# Patient Record
Sex: Female | Born: 1980 | Race: White | Hispanic: No | Marital: Married | Smoking: Never smoker
Health system: Southern US, Community
[De-identification: ages and names within clinical notes are randomized; demographics above are authoritative.]

## PROBLEM LIST (undated history)

## (undated) ENCOUNTER — Inpatient Hospital Stay (HOSPITAL_COMMUNITY): Payer: Self-pay

## (undated) DIAGNOSIS — F419 Anxiety disorder, unspecified: Secondary | ICD-10-CM

## (undated) DIAGNOSIS — O1490 Unspecified pre-eclampsia, unspecified trimester: Secondary | ICD-10-CM

## (undated) DIAGNOSIS — R519 Headache, unspecified: Secondary | ICD-10-CM

## (undated) DIAGNOSIS — K219 Gastro-esophageal reflux disease without esophagitis: Secondary | ICD-10-CM

## (undated) HISTORY — DX: Anxiety disorder, unspecified: F41.9

## (undated) HISTORY — DX: Unspecified pre-eclampsia, unspecified trimester: O14.90

## (undated) HISTORY — DX: Headache, unspecified: R51.9

---

## 2003-01-02 ENCOUNTER — Other Ambulatory Visit: Admission: RE | Admit: 2003-01-02 | Discharge: 2003-01-02 | Payer: Self-pay | Admitting: Gynecology

## 2003-10-07 ENCOUNTER — Emergency Department (HOSPITAL_COMMUNITY): Admission: EM | Admit: 2003-10-07 | Discharge: 2003-10-07 | Payer: Self-pay | Admitting: Emergency Medicine

## 2004-05-29 ENCOUNTER — Emergency Department (HOSPITAL_COMMUNITY): Admission: EM | Admit: 2004-05-29 | Discharge: 2004-05-29 | Payer: Self-pay | Admitting: Emergency Medicine

## 2004-08-25 DIAGNOSIS — O1490 Unspecified pre-eclampsia, unspecified trimester: Secondary | ICD-10-CM

## 2004-08-25 HISTORY — DX: Unspecified pre-eclampsia, unspecified trimester: O14.90

## 2004-09-05 ENCOUNTER — Observation Stay (HOSPITAL_COMMUNITY): Admission: AD | Admit: 2004-09-05 | Discharge: 2004-09-05 | Payer: Self-pay | Admitting: Obstetrics

## 2004-09-08 ENCOUNTER — Ambulatory Visit: Payer: Self-pay | Admitting: Pulmonary Disease

## 2004-09-08 ENCOUNTER — Inpatient Hospital Stay (HOSPITAL_COMMUNITY): Admission: AD | Admit: 2004-09-08 | Discharge: 2004-09-23 | Payer: Self-pay | Admitting: Obstetrics & Gynecology

## 2004-09-11 ENCOUNTER — Ambulatory Visit: Payer: Self-pay | Admitting: Neonatology

## 2004-09-15 ENCOUNTER — Encounter (INDEPENDENT_AMBULATORY_CARE_PROVIDER_SITE_OTHER): Payer: Self-pay | Admitting: Specialist

## 2004-09-20 ENCOUNTER — Encounter: Payer: Self-pay | Admitting: Obstetrics

## 2004-10-05 ENCOUNTER — Ambulatory Visit: Payer: Self-pay | Admitting: Internal Medicine

## 2004-11-04 ENCOUNTER — Ambulatory Visit: Payer: Self-pay | Admitting: Internal Medicine

## 2005-07-12 ENCOUNTER — Ambulatory Visit: Payer: Self-pay | Admitting: Internal Medicine

## 2006-01-01 ENCOUNTER — Emergency Department (HOSPITAL_COMMUNITY): Admission: EM | Admit: 2006-01-01 | Discharge: 2006-01-01 | Payer: Self-pay | Admitting: Emergency Medicine

## 2010-02-25 ENCOUNTER — Emergency Department (HOSPITAL_COMMUNITY): Admission: EM | Admit: 2010-02-25 | Discharge: 2010-02-25 | Payer: Self-pay | Admitting: Family Medicine

## 2011-01-12 LAB — POCT RAPID STREP A (OFFICE): Streptococcus, Group A Screen (Direct): NEGATIVE

## 2011-03-12 NOTE — Discharge Summary (Signed)
NAMEMANAL, KREUTZER            ACCOUNT NO.:  [id number]   MEDICAL RECORD NO.:  [id number]          PATIENT TYPE:  INP   LOCATION:  9145                          FACILITY:  [facility]   PHYSICIAN:  Charles A. Clearance Coots, M.D.DATE OF BIRTH:  04/21/1981   DATE OF ADMISSION:  09/08/2004  DATE OF DISCHARGE:  09/23/2004                                 DISCHARGE SUMMARY   ADMISSION DIAGNOSES:  1.  At [redacted] weeks gestation.  2.  Preeclampsia.   DISCHARGE DIAGNOSES:  1.  At [redacted] weeks gestation.  2.  Preeclampsia.  3.  Status post delivery by cesarean section for severe preeclampsia on      September 15, 2004, at 12:54.  A viable female infant with Apgars of 8 at      one minute, 8 at nine minutes, weight of 1373 g, length of 38 cm.  4.  Postpartum preeclampsia.  5.  Posterior reversible encephalopathy syndrome (PRES), resolved.   Infant in neonatal intensive care unit for prematurity.  Mother discharged  home in good condition.   REASON FOR ADMISSION:  A 30 year old white female G1 presents at 30-2/[redacted]  weeks gestation in follow-up after being seen over the weekend in the  maternity admissions unit for increased blood pressure with headache.  Patient presents to the office the day of admission with the complaint of  headache.  She also had an 11 pound weight gain in two weeks.  A decision  was made to admit to the hospital for pregnancy induced hypertension,  probable preeclampsia.   PAST MEDICAL HISTORY:  1.  Surgeries:  None.  2.  Illnesses:  None.   MEDICATIONS:  Prenatal vitamins.   ALLERGIES:  No known drug allergies.   SOCIAL HISTORY:  Single.  Negative alcohol or drug use.  Smokes  approximately four cigarettes daily.   PHYSICAL EXAMINATION:  GENERAL APPEARANCE:  A well-developed, well-nourished  white female in no acute distress.  VITAL SIGNS:  Temperature 97.6, pulse 68, respiratory rate 18, blood  pressure 142/92, height 5 feet 3 inches, weight 173 pounds.  HEENT:  Normal except  for facial edema.  LUNGS:  Clear to auscultation bilaterally.  CARDIOVASCULAR:  Regular rate and rhythm without murmurs, rubs, or gallops.  ABDOMEN:  Gravid, nontender.  EXTREMITIES:  Deep tendon reflexes were 2+ without clonus.  CERVIX:  Cervical examination was omitted.   External fetal monitoring revealed fetal heart rate baseline of 140 to 150  beats per minute, moderate variability, positive reactivity with no  decelerations.  There were no uterine contractions.   LABORATORY DATA:  Uric acid significant for 6.1, SGOT and SGPT 36 and 29,  respectively. Serum creatinine 0.8. LDH 145.  Hemoglobin 11.9, hematocrit  34.2, platelets 180,000.   HOSPITAL COURSE:  The patient was admitted and placed on bed rest.  A 24-  hour urine was started for total protein and creatinine clearance and daily  weights were monitored. She was started on magnesium sulfate for seizure  prophylaxis. A 24-hour urine revealed a total protein of 5 g.  The patient,  for the first several days, did fairly well with reactive  monitoring on  heart rate tracings.  She was given steroids on hospital day #45for  acceleration of fetal lung maturity.  Maternal fetal medicine consultation  was obtained on hospital day #6 because the patient started feeling chest  tightness and had developed some blurred vision.  Chest x-ray was obtained  which revealed pulmonary edema.  A decision was made to proceed with  delivery.  The patient was moved to labor and delivery for cervical ripening  and induction of labor but she complained of having worsening blurred vision  and could not see very well and on examination after cervical ripening, the  cervix was still very unfavorable for rapid delivery being only 1 cm dilated  externally with closed internal os examination and about 40 to 50% effaced  and the vertex at a -3 station.  Because of the very unfavorable cervix and  severity of the preeclampsia approaching eclampsia with the  blurred vision.  A decision was made to proceed with cesarean section delivery.   The patient underwent a cesarean section delivery on September 15, 2004, at  [redacted] weeks gestation.  This was a primary low transverse cesarean section. A  viable infant was delivered.  There were no intraoperative complications.  The patient was continued on magnesium sulfate postoperatively for seizure  prophylaxis.  She had significantly elevated blood pressures postoperatively  and was started on several antihypertensives without good success with  control of her blood pressure.  The patient also had continued blurred  vision which had transiently cleared immediately after delivery but on  postoperative day #2, the blurred vision had returned and was worse.  Because of inability to gain good control of blood pressure, critical care  consultation was made with Dr. Sarina Ser, pulmonary and critical care  specialist, and recommendations were made regarding patient's blood pressure  management.  CT scan and MRI was also obtained of the head and further  diagnosis of posterior reversible encephalopathy syndrome (PRES) was  diagnosed with CT findings as the etiology of the patient's blurred vision  and at that point, by postoperative day #5 the patient was beginning to  diurese quite well but continued to have blurred vision.  Weight loss was  significant after the patient began to diurese well and magnesium sulfate  was then discontinued.  The patient continued to have brisk diuresis and  weight loss with much improved blood pressure control after consultation and  recommendations by Dr. Jayme Cloud were made.  The patient was maintained on  antihypertensive regimen of Cardene 60 mg SR b.i.d. and Maxzide 25 one  tablet b.i.d.   The patient continued to have resolution of preeclampsia and the visual  changes completely resolved by postoperative day #8 and the patient was therefore discharged home in good  condition on postoperative day #8.   DISCHARGE LABORATORY DATA:  Hemoglobin 9.6, hematocrit 27.6, white blood  cell count 13,700, platelets 172,000.  Sodium 138, potassium 3.5, glucose  79, creatinine 0.8, BUN 12.   DISCHARGE MEDICATIONS:  1.  Continue Cardene 60 mg SR b.i.d.  2.  Continue Maxzide 25 one pill b.i.d.   The patient is to follow up with Dr. Sonda Primes at [facility] on October 05, 2004.  Appointment was made prior to  discharge for the patient.  She will also follow up at the [facility] in two weeks for a postpartum check-up.   Instructions were given and routine written instructions per booklet were  given  for obstetrical discharge after cesarean section.     Char   CAH/MEDQ  D:  09/23/2004  T:  09/23/2004  Job:  [id number]   cc:   Sarina Ser, M.D.  Pulmonary and Critical Care Medicine  [address].   Georgina Quint. Plotnikov, M.D. [facility]

## 2011-03-12 NOTE — Op Note (Signed)
NAMESHARIE, Tina [name]            ACCOUNT NO.:  [id number]   MEDICAL RECORD NO.:  [id number]          PATIENT TYPE:  INP   LOCATION:  9499                          FACILITY:  [facility]   PHYSICIAN:  Roseanna Rainbow, M.D.DATE OF BIRTH:  November 10, 1980   DATE OF PROCEDURE:  09/15/2004  DATE OF DISCHARGE:                                 OPERATIVE REPORT   PREOPERATIVE DIAGNOSES:  Intrauterine pregnancy at term at 31+ weeks with  severe preeclampsia, pulmonary edema, neurological symptoms.   POSTOPERATIVE DIAGNOSES:  Intrauterine pregnancy at term at 31+ weeks with  severe preeclampsia, pulmonary edema, neurological symptoms.   PROCEDURE:  Primary low uterine flap elliptical cesarean delivery via  Pfannenstiel skin incision.   SURGEONS:  1.  Roseanna Rainbow, M.D.  2.  Charles A. Clearance Coots, M.D.   ANESTHESIA:  Spinal.   ESTIMATED BLOOD LOSS:  800 mL.   COMPLICATIONS:  None.   FLUIDS AND URINE OUTPUT:  Per anesthesiology.   DESCRIPTION OF PROCEDURE:  The patient was taken to the operating room.  She  was placed in the dorsal supine position and prepped and draped in the usual  sterile fashion.  A Pfannenstiel skin incision was then made with a scalpel  and carried down to the underlying fascia with the Bovie.  The fascia was  nicked in the midline.  This incision was then extended bilaterally with  curved Mayo scissors.  The superior aspect of the fascial incision was then  tented up with Kocher clamps and the underlying rectus muscles dissected  off.  The inferior aspect of the fascial incision was then manipulated in a  similar fashion.  The rectus muscles were separated in the midline.  The  parietal peritoneum was entered bluntly.  The peritoneal incision was then  extended superiorly and inferiorly with good visualization of the bladder.  The bladder blade was then placed; the vesicouterine peritoneum was tented  up and entered sharply with Metzenbaum scissors.  This  incision was then  extended bilaterally and the bladder flap created bluntly.  The bladder  blade was then replaced.  The lower uterine segment was then incised in a  transverse fashion with the scalpel.  This incision was then extended  bilateral with the bandage scissors.  The infant's head was delivered  atraumatically.  A nuchal cord x 2 was noted.  The cord was clamped and cut,  and the infant was handed off to the awaiting neonatologist.  Apgars were 8  at one and 5 minutes, respectively.  An umbilical artery pH was sent.  The  placenta was then removed.  The intrauterine cavity was evacuated of any  remaining amniotic fluid, clots, and debris with a moist laparotomy sponge.  There is moderate atony appreciated at this point.  The uterus was  exteriorized and massaged.  Pitocin was infiltrated into the fundus.  The  atony responded appropriately with improved tone.  The uterine incision was  then reapproximated in a running interlocking fashion with 0 Monocryl.  A  second imbricating layer was then placed.  The uterus was returned to the  abdomen.  The paracolic  gutters were then copiously irrigated.  The parietal  peritoneum was reapproximated with 0 Vicryl.  The fascia was reapproximated  in a  running fashion with 0 Vicryl.  The skin was reapproximated with staples.  At the close of the procedure, the instrument and pack counts were said to  be correct x 2.  The patient was taken to the PACU awake and in stable  condition.  One gram of cefazolin was given at cord clamp.     [name]  D:  09/15/2004  T:  09/15/2004  Job:  [id number]

## 2013-08-06 ENCOUNTER — Other Ambulatory Visit: Payer: Self-pay

## 2013-08-08 ENCOUNTER — Encounter (HOSPITAL_COMMUNITY): Payer: Self-pay | Admitting: *Deleted

## 2013-08-08 ENCOUNTER — Inpatient Hospital Stay (HOSPITAL_COMMUNITY): Payer: Managed Care, Other (non HMO)

## 2013-08-08 ENCOUNTER — Inpatient Hospital Stay (HOSPITAL_COMMUNITY)
Admission: AD | Admit: 2013-08-08 | Discharge: 2013-08-08 | Disposition: A | Discharge status: Home / Self Care | Payer: Managed Care, Other (non HMO) | Source: Ambulatory Visit | Attending: Obstetrics & Gynecology | Admitting: Obstetrics & Gynecology

## 2013-08-08 ENCOUNTER — Other Ambulatory Visit (HOSPITAL_COMMUNITY): Payer: Managed Care, Other (non HMO)

## 2013-08-08 DIAGNOSIS — O219 Vomiting of pregnancy, unspecified: Secondary | ICD-10-CM

## 2013-08-08 DIAGNOSIS — O99891 Other specified diseases and conditions complicating pregnancy: Secondary | ICD-10-CM | POA: Insufficient documentation

## 2013-08-08 DIAGNOSIS — O26899 Other specified pregnancy related conditions, unspecified trimester: Secondary | ICD-10-CM

## 2013-08-08 DIAGNOSIS — M549 Dorsalgia, unspecified: Secondary | ICD-10-CM | POA: Insufficient documentation

## 2013-08-08 DIAGNOSIS — M533 Sacrococcygeal disorders, not elsewhere classified: Secondary | ICD-10-CM | POA: Insufficient documentation

## 2013-08-08 DIAGNOSIS — Z3490 Encounter for supervision of normal pregnancy, unspecified, unspecified trimester: Secondary | ICD-10-CM

## 2013-08-08 DIAGNOSIS — O210 Mild hyperemesis gravidarum: Secondary | ICD-10-CM | POA: Insufficient documentation

## 2013-08-08 LAB — CBC
HCT: 34 % — ABNORMAL LOW (ref 36.0–46.0)
Hemoglobin: 11.9 g/dL — ABNORMAL LOW (ref 12.0–15.0)
MCH: 31.8 pg (ref 26.0–34.0)
MCHC: 35 g/dL (ref 30.0–36.0)
MCV: 90.9 fL (ref 78.0–100.0)
Platelets: 216 10*3/uL (ref 150–400)
RBC: 3.74 MIL/uL — ABNORMAL LOW (ref 3.87–5.11)
RDW: 12.8 % (ref 11.5–15.5)
WBC: 7.9 10*3/uL (ref 4.0–10.5)

## 2013-08-08 LAB — WET PREP, GENITAL
Clue Cells Wet Prep HPF POC: NONE SEEN
Trich, Wet Prep: NONE SEEN
Yeast Wet Prep HPF POC: NONE SEEN

## 2013-08-08 LAB — URINALYSIS, ROUTINE W REFLEX MICROSCOPIC
Glucose, UA: NEGATIVE mg/dL
Hgb urine dipstick: NEGATIVE
Ketones, ur: NEGATIVE mg/dL
Nitrite: NEGATIVE
Specific Gravity, Urine: >1.03 — ABNORMAL HIGH (ref 1.005–1.030)
Urobilinogen, UA: 0.2 mg/dL (ref 0.0–1.0)
pH: 6 (ref 5.0–8.0)

## 2013-08-08 LAB — HCG, QUANTITATIVE, PREGNANCY: hCG, Beta Chain, Quant, S: 31709 m[IU]/mL — ABNORMAL HIGH (ref <5)

## 2013-08-08 LAB — POCT PREGNANCY, URINE: Preg Test, Ur: POSITIVE — AB

## 2013-08-08 MED ORDER — OXYCODONE-ACETAMINOPHEN 5-325 MG PO TABS: 1.0000 | ORAL_TABLET | Freq: Four times a day (QID) | ORAL | Status: DC | PRN

## 2013-08-08 MED ORDER — DOXYLAMINE-PYRIDOXINE 10-10 MG PO TBEC: 1.0000 | DELAYED_RELEASE_TABLET | Freq: Two times a day (BID) | ORAL | Status: DC

## 2013-08-08 NOTE — MAU Provider Note (Signed)
Chief Complaint: Back Pain   None    SUBJECTIVE HPI: Tina [name] is a 32 y.o. G2P0101 at [redacted]w[redacted]d by LMP who presents to maternity admissions reporting severe back pain x2 weeks.  Patient's last menstrual period was 06/30/2013.  She is unsure of this date.  She denies change in activity or injury prior to onset of back pain.  She reports she has never had this type of pain before.  Tylenol has not relieved the pain.  She had preeclampsia in her previous pregnancy and had an emergency C/S and is worried that something is wrong with this pregnancy.  She reports daily nausea, without vomiting, and this makes it hard for her to keep down fluids.  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 She denies vaginal bleeding, vaginal itching/burning, urinary symptoms, h/a, dizziness, n/v, or fever/chills.   History reviewed. No pertinent past medical history. Past Surgical History  Procedure Laterality Date  . Cesarean section     History   Social History  . Marital Status: Married    Spouse Name: N/A    Number of Children: N/A  . Years of Education: N/A   Occupational History  . Not on file.   Social History Main Topics  . Smoking status: Never Smoker   . Smokeless tobacco: Never Used  . Alcohol Use: No  . Drug Use: No  . Sexual Activity: Yes    Birth Control/ Protection: None   Other Topics Concern  . Not on file   Social History Narrative  . No narrative on file   No current facility-administered medications on file prior to encounter.   No current outpatient prescriptions on file prior to encounter.   No Known Allergies  ROS: Pertinent items in HPI  OBJECTIVE Blood pressure 130/69, pulse 70, temperature 98.4 F (36.9 C), temperature source Oral, resp. rate 16, height 5' 3.5" (1.613 m), weight 90.992 kg (200 lb 9.6 oz), last menstrual period 06/30/2013, SpO2 100.00%. GENERAL: Well-developed, well-nourished female in  no acute distress.  HEENT: Normocephalic HEART: normal rate RESP: normal effort ABDOMEN: Soft, non-tender BACK:  Tenderness of sacrum and sacroiliac joint bilaterally EXTREMITIES: Nontender, no edema NEURO: Alert and oriented Pelvic exam: Cervix pink, visually closed, without lesion, scant white creamy discharge, vaginal walls and external genitalia normal Bimanual exam: Cervix 0/long/high, firm, anterior, neg CMT, uterus nontender, nonenlarged, adnexa without tenderness, enlargement, or mass  LAB RESULTS Results for orders placed during the hospital encounter of 08/08/13 (from the past 24 hour(s))  URINALYSIS, ROUTINE W REFLEX MICROSCOPIC     Status: Abnormal   Collection Time    08/08/13  2:10 PM      Result Value Range   Color, Urine YELLOW  YELLOW   APPearance CLEAR  CLEAR   Specific Gravity, Urine >1.030 (*) 1.005 - 1.030   pH 6.0  5.0 - 8.0   Glucose, UA NEGATIVE  NEGATIVE mg/dL   Hgb urine dipstick NEGATIVE  NEGATIVE   Bilirubin Urine NEGATIVE  NEGATIVE   Ketones, ur NEGATIVE  NEGATIVE mg/dL   Protein, ur NEGATIVE  NEGATIVE mg/dL   Urobilinogen, UA 0.2  0.0 - 1.0 mg/dL   Nitrite NEGATIVE  NEGATIVE   Leukocytes, UA NEGATIVE  NEGATIVE  POCT PREGNANCY, URINE     Status: Abnormal   Collection Time    08/08/13  2:14 PM      Result Value Range   Preg Test, Ur POSITIVE (*) NEGATIVE  CBC     Status: Abnormal   Collection Time    08/08/13  3:03 PM      Result Value Range   WBC 7.9  4.0 - 10.5 K/uL   RBC 3.74 (*) 3.87 - 5.11 MIL/uL   Hemoglobin 11.9 (*) 12.0 - 15.0 g/dL   HCT 16.1 (*) 09.6 - 04.5 %   MCV 90.9  78.0 - 100.0 fL   MCH 31.8  26.0 - 34.0 pg   MCHC 35.0  30.0 - 36.0 g/dL   RDW 40.9  81.1 - 91.4 %   Platelets 216  150 - 400 K/uL    IMAGING US Ob Comp Less 14 Wks  08/08/2013   CLINICAL DATA:  Back pain.  A  EXAM: OBSTETRIC <14 WK ULTRASOUND  TECHNIQUE: Transabdominal ultrasound was performed for evaluation of the gestation as well as the maternal uterus and  adnexal regions.  COMPARISON:  None.  FINDINGS: Intrauterine gestational sac: Visualized/normal in shape.  Yolk sac:  Present  Embryo:  Present  Cardiac Activity: Present  Heart Rate: 115 beats per min bpm  MSD:   mm    w     d  CRL:   2.8  mm   6 w 0 d                  [place] EDC: 04/03/2014  Maternal uterus/adnexae: No subchorionic hemorrhage. Ovaries are symmetric in size and echotexture. No adnexal masses. No free fluid.  IMPRESSION: Six week intrauterine pregnancy. Fetal heart rate 115 beats per min. No acute maternal findings.   Electronically Signed   By: Charlett Nose M.D.   On: 08/08/2013 16:37    ASSESSMENT 1. Sacroiliac pain in pregnancy   2. Normal IUP (intrauterine pregnancy) on prenatal ultrasound   3. Nausea/vomiting in pregnancy     PLAN Discharge home Education regarding diagnosis, exercises, good ergonomics info given Percocet 5/325, take 1 Q 6 hours PRN x10 tablets Diclegis BID F/U in office--discuss physical therapy, brace/binder in office Return to MAU as needed    Medication List    ASK your doctor about these medications       GINGER ROOT PO  Take 1 tablet by mouth daily.         Sharen Counter Certified Nurse-Midwife 08/08/2013  3:49 PM

## 2013-08-08 NOTE — MAU Note (Signed)
Patient states she has had a positive pregnancy test at an Urgent Care. Has had low back pain for 2 weeks. Denies bleeding but has a little vaginal discharge.

## 2013-08-09 LAB — GC/CHLAMYDIA PROBE AMP
CT Probe RNA: NEGATIVE
GC Probe RNA: NEGATIVE

## 2013-08-21 ENCOUNTER — Encounter: Payer: Self-pay | Admitting: Advanced Practice Midwife

## 2013-08-21 ENCOUNTER — Ambulatory Visit (INDEPENDENT_AMBULATORY_CARE_PROVIDER_SITE_OTHER): Payer: Managed Care, Other (non HMO) | Admitting: Advanced Practice Midwife

## 2013-08-21 VITALS — BP 124/81 | HR 66 | Temp 98.4°F

## 2013-08-21 VITALS — BP 124/81 | Temp 98.4°F | Wt 199.2 lb

## 2013-08-21 DIAGNOSIS — O219 Vomiting of pregnancy, unspecified: Secondary | ICD-10-CM | POA: Insufficient documentation

## 2013-08-21 DIAGNOSIS — Z3481 Encounter for supervision of other normal pregnancy, first trimester: Secondary | ICD-10-CM

## 2013-08-21 DIAGNOSIS — Z3201 Encounter for pregnancy test, result positive: Secondary | ICD-10-CM

## 2013-08-21 DIAGNOSIS — O09291 Supervision of pregnancy with other poor reproductive or obstetric history, first trimester: Secondary | ICD-10-CM | POA: Insufficient documentation

## 2013-08-21 DIAGNOSIS — Z98891 History of uterine scar from previous surgery: Secondary | ICD-10-CM

## 2013-08-21 DIAGNOSIS — O09299 Supervision of pregnancy with other poor reproductive or obstetric history, unspecified trimester: Secondary | ICD-10-CM

## 2013-08-21 DIAGNOSIS — Z3480 Encounter for supervision of other normal pregnancy, unspecified trimester: Secondary | ICD-10-CM

## 2013-08-21 LAB — COMPREHENSIVE METABOLIC PANEL
AST: 28 U/L (ref 0–37)
Albumin: 4.2 g/dL (ref 3.5–5.2)
Alkaline Phosphatase: 56 U/L (ref 39–117)
BUN: 9 mg/dL (ref 6–23)
CO2: 24 mEq/L (ref 19–32)
Calcium: 9.4 mg/dL (ref 8.4–10.5)
Chloride: 101 mEq/L (ref 96–112)
Creat: 0.7 mg/dL (ref 0.50–1.10)
Glucose, Bld: 69 mg/dL — ABNORMAL LOW (ref 70–99)
Potassium: 3.7 mEq/L (ref 3.5–5.3)
Sodium: 136 mEq/L (ref 135–145)
Total Bilirubin: 0.5 mg/dL (ref 0.3–1.2)
Total Protein: 6.8 g/dL (ref 6.0–8.3)

## 2013-08-21 LAB — POCT URINALYSIS DIPSTICK
Blood, UA: NEGATIVE
Glucose, UA: NEGATIVE
Ketones, UA: NEGATIVE
Nitrite, UA: NEGATIVE
Spec Grav, UA: 1.015
pH, UA: 7

## 2013-08-21 LAB — LACTATE DEHYDROGENASE: LDH: 171 U/L (ref 94–250)

## 2013-08-21 MED ORDER — DOCUSATE SODIUM 100 MG PO CAPS: 100.0000 mg | ORAL_CAPSULE | Freq: Two times a day (BID) | ORAL | Status: DC | PRN

## 2013-08-21 MED ORDER — METOCLOPRAMIDE HCL 10 MG PO TABS: 10.0000 mg | ORAL_TABLET | Freq: Three times a day (TID) | ORAL | Status: DC

## 2013-08-21 MED ORDER — ONDANSETRON HCL 4 MG PO TABS: 4.0000 mg | ORAL_TABLET | Freq: Three times a day (TID) | ORAL | Status: DC | PRN

## 2013-08-21 NOTE — Progress Notes (Signed)
Pulse- 66 . Subjective:    Tina [name] is being seen today for her first obstetrical visit.  This is not a planned pregnancy. She is at [redacted]w[redacted]d gestation. Her obstetrical history is significant for pre-eclampsia. Relationship with [name]: spouse, living together. Patient does intend to breast feed. Pregnancy history fully reviewed.  Patient reports she is experiencing vomitting at night. No relief w/ Diclegis. Would like to try other methods for control. Not able to eat or drink much, feeling miserable.  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 Reports MRI showed fluid on the brain.  Very anxious for this pregnancy. Same FOC.  Menstrual History: OB History   Grav Para Term Preterm Abortions TAB SAB Ect Mult Living   2 1 0 1 0 0 0 0 0 1       Menarche age: 65 Patient's last menstrual period was 06/30/2013.    The following portions of the patient's history were reviewed and updated as appropriate: allergies, current medications, past family history, past medical history, past social history, past surgical history and problem list.  Review of Systems A comprehensive review of systems was negative except for: Constitutional: positive for unknown if patient has had weight loss Gastrointestinal: positive for nausea and vomiting Musculoskeletal: positive for back pain    Objective:    BP 124/81  Temp(Src) 98.4 F (36.9 C)  Wt 199 lb 3.2 oz (90.357 kg)  BMI 34.73 kg/m2  LMP 06/30/2013  General Appearance:    Alert, cooperative, no distress, appears stated age  Head:    Normocephalic, without obvious abnormality, atraumatic  Eyes:    PERRL, conjunctiva/corneas clear, EOM's intact, fundi    benign, both eyes  Ears:    Normal TM's and external ear canals, both ears  Nose:   Nares normal, septum midline, mucosa normal, no drainage    or sinus tenderness  Throat:   Lips, mucosa, and tongue normal; teeth and gums normal  Neck:   Supple,  symmetrical, trachea midline, no adenopathy;    thyroid:  no enlargement/tenderness/nodules; no carotid   bruit or JVD  Back:     Symmetric, no curvature, ROM normal, no CVA tenderness  Lungs:     Clear to auscultation bilaterally, respirations unlabored  Chest Wall:    No tenderness or deformity   Heart:    Regular rate and rhythm, S1 and S2 normal, no murmur, rub   or gallop  Breast Exam:    No tenderness, masses, or nipple abnormality  Abdomen:     Soft, non-tender, bowel sounds active all four quadrants,    no masses, no organomegaly  Genitalia:  Not indicated  Rectal:    Extremities:   Extremities normal, atraumatic, no cyanosis or edema  Pulses:   2+ and symmetric all extremities  Skin:   Skin color, texture, turgor normal, no rashes or lesions  Lymph nodes:   Cervical, supraclavicular, and axillary nodes normal  Neurologic:   CNII-XII intact, normal strength, sensation and reflexes    throughout      Assessment:    Pregnancy at [redacted]w[redacted]d weeks by L, 6 wk [place] Patient Active Problem List   Diagnosis Date Noted  . Hx of HELLP syndrome, currently pregnant 08/21/2013  . H/O: C-section 08/21/2013  . Nausea and vomiting in pregnancy 08/21/2013  . High risk pregnancy due to history of previous obstetrical problem in first trimester 08/21/2013      Plan:    Initial labs drawn. Prenatal vitamins.  Counseling provided regarding continued use of seat belts, cessation of alcohol consumption, smoking or use of illicit drugs; infection precautions i.e., influenza/TDAP immunizations, toxoplasmosis,CMV, parvovirus, listeria and varicella; workplace safety, exercise during pregnancy; routine dental care, safe medications, sexual activity, hot tubs, saunas, pools, travel, caffeine use, fish and methlymercury, potential toxins, hair treatments, varicose veins Weight gain recommendations reviewed: underweight/BMI< 18.5--> gain 28 - 40 lbs; normal weight/BMI 18.5 - 24.9--> gain 25 - 35 lbs;  overweight/BMI 25 - 29.9--> gain 15 - 25 lbs; obese/BMI >30->gain  11 - 20 lbs Problem list reviewed and updated. AFP3 discussed: plan at subsequent visits.. Role of ultrasound in pregnancy discussed; fetal survey: requested. Amniocentesis discussed: not indicated. Follow up in 2 weeks. Monitor N/V. MFM consult due to Hx of Pre-eclampsia.  Baseline preeclampsia e labs today, patient to bring in 24 hour urine NV.   80% of 40 min visit spent on counseling and coordination of care.   Charnele Semple Wilson Singer CNM

## 2013-08-22 LAB — OBSTETRIC PANEL
Antibody Screen: NEGATIVE
Basophils Absolute: 0 10*3/uL (ref 0.0–0.1)
Basophils Relative: 0 % (ref 0–1)
Eosinophils Absolute: 0.1 10*3/uL (ref 0.0–0.7)
Eosinophils Relative: 1 % (ref 0–5)
HCT: 37.1 % (ref 36.0–46.0)
Hemoglobin: 12.9 g/dL (ref 12.0–15.0)
Hepatitis B Surface Ag: NEGATIVE
Lymphocytes Relative: 23 % (ref 12–46)
Lymphs Abs: 2.1 10*3/uL (ref 0.7–4.0)
MCH: 32.8 pg (ref 26.0–34.0)
MCHC: 34.8 g/dL (ref 30.0–36.0)
MCV: 94.4 fL (ref 78.0–100.0)
Monocytes Absolute: 0.5 10*3/uL (ref 0.1–1.0)
Monocytes Relative: 6 % (ref 3–12)
Neutro Abs: 6.2 10*3/uL (ref 1.7–7.7)
Neutrophils Relative %: 70 % (ref 43–77)
Platelets: 248 10*3/uL (ref 150–400)
RBC: 3.93 MIL/uL (ref 3.87–5.11)
Rh Type: POSITIVE

## 2013-08-22 LAB — HIV ANTIBODY (ROUTINE TESTING W REFLEX): HIV: NONREACTIVE

## 2013-08-22 LAB — CULTURE, OB URINE
Colony Count: NO GROWTH
Organism ID, Bacteria: NO GROWTH

## 2013-08-22 LAB — PROTEIN / CREATININE RATIO, URINE
Creatinine, Urine: 229.6 mg/dL
Protein Creatinine Ratio: 0.03 (ref <0.15)
Total Protein, Urine: 8 mg/dL

## 2013-08-22 LAB — VITAMIN D 25 HYDROXY (VIT D DEFICIENCY, FRACTURES): Vit D, 25-Hydroxy: 69 ng/mL (ref 30–89)

## 2013-08-23 LAB — HEMOGLOBINOPATHY EVALUATION
Hemoglobin Other: 0 %
Hgb A2 Quant: 2.4 % (ref 2.2–3.2)
Hgb A: 97.6 % (ref 96.8–97.8)
Hgb F Quant: 0 % (ref 0.0–2.0)
Hgb S Quant: 0 %

## 2013-08-28 ENCOUNTER — Encounter: Payer: Self-pay | Admitting: Obstetrics

## 2013-08-31 ENCOUNTER — Other Ambulatory Visit: Payer: Self-pay | Admitting: Advanced Practice Midwife

## 2013-08-31 DIAGNOSIS — Z3689 Encounter for other specified antenatal screening: Secondary | ICD-10-CM

## 2013-09-03 ENCOUNTER — Other Ambulatory Visit: Payer: Managed Care, Other (non HMO)

## 2013-09-03 DIAGNOSIS — O139 Gestational [pregnancy-induced] hypertension without significant proteinuria, unspecified trimester: Secondary | ICD-10-CM

## 2013-09-03 DIAGNOSIS — O219 Vomiting of pregnancy, unspecified: Secondary | ICD-10-CM

## 2013-09-03 DIAGNOSIS — O09299 Supervision of pregnancy with other poor reproductive or obstetric history, unspecified trimester: Secondary | ICD-10-CM

## 2013-09-03 DIAGNOSIS — Z3481 Encounter for supervision of other normal pregnancy, first trimester: Secondary | ICD-10-CM

## 2013-09-04 ENCOUNTER — Encounter: Payer: Self-pay | Admitting: Advanced Practice Midwife

## 2013-09-04 ENCOUNTER — Ambulatory Visit (HOSPITAL_COMMUNITY): Payer: Managed Care, Other (non HMO)

## 2013-09-04 ENCOUNTER — Ambulatory Visit (HOSPITAL_COMMUNITY): Admission: RE | Admit: 2013-09-04 | Payer: Managed Care, Other (non HMO) | Source: Ambulatory Visit

## 2013-09-04 ENCOUNTER — Ambulatory Visit (INDEPENDENT_AMBULATORY_CARE_PROVIDER_SITE_OTHER): Payer: Managed Care, Other (non HMO) | Admitting: Advanced Practice Midwife

## 2013-09-04 ENCOUNTER — Other Ambulatory Visit: Payer: Self-pay | Admitting: *Deleted

## 2013-09-04 VITALS — BP 129/86 | Temp 99.1°F | Wt 200.0 lb

## 2013-09-04 DIAGNOSIS — Z3480 Encounter for supervision of other normal pregnancy, unspecified trimester: Secondary | ICD-10-CM

## 2013-09-04 DIAGNOSIS — Z3481 Encounter for supervision of other normal pregnancy, first trimester: Secondary | ICD-10-CM

## 2013-09-04 LAB — PROTEIN, URINE, 24 HOUR
Protein, 24H Urine: 80 mg/d (ref 50–100)
Protein, Urine: 8 mg/dL

## 2013-09-04 LAB — CREATININE CLEARANCE, URINE, 24 HOUR
Creatinine, Urine: 198.6 mg/dL
Creatinine: 0.65 mg/dL (ref 0.50–1.10)

## 2013-09-04 LAB — POCT URINALYSIS DIPSTICK
Bilirubin, UA: NEGATIVE
Glucose, UA: NEGATIVE
Ketones, UA: NEGATIVE
Nitrite, UA: NEGATIVE
Urobilinogen, UA: NEGATIVE
pH, UA: 5

## 2013-09-04 MED ORDER — ONDANSETRON HCL 4 MG PO TABS: 4.0000 mg | ORAL_TABLET | Freq: Three times a day (TID) | ORAL | Status: DC | PRN

## 2013-09-04 NOTE — Progress Notes (Signed)
HR - 84 Pt in office for routine office visit, would like a refill for Zofran,

## 2013-09-04 NOTE — Progress Notes (Signed)
Routine Obstetrical Visit  Subjective:    Tina [name] is being seen today for her routine obstetrical visit. She is at [redacted]w[redacted]d gestation.   Patient reports N/V improves w/ zofran, patient would like refill. No relief w/ reglan. Reports constipation.   Patient was never notified of appt scheduled today.   Objective:     BP 129/86  Temp(Src) 99.1 F (37.3 C)  Wt 200 lb (90.719 kg)  LMP 06/30/2013 Physical Exam  Exam   FHR 160 FH SP  Assessment:    Pregnancy: G2P0101 Patient Active Problem List   Diagnosis Date Noted  . Hx of HELLP syndrome, currently pregnant 08/21/2013  . H/O: C-section 08/21/2013  . Nausea and vomiting in pregnancy 08/21/2013  . High risk pregnancy due to history of previous obstetrical problem in first trimester 08/21/2013       Plan:     Prenatal vitamins. Problem list reviewed and updated.  results reviewed. Follow up in 4 weeks. Zofran refill Encouraged patient to take baby aspirin at this time while she awaits MFM appt. Reschedule MFM appt, pending.  80% of 20 min visit spent on counseling and coordination of care.     Aliene Tamura 09/04/2013

## 2013-09-11 ENCOUNTER — Ambulatory Visit (HOSPITAL_COMMUNITY)
Admission: RE | Admit: 2013-09-11 | Discharge: 2013-09-11 | Disposition: A | Discharge status: Home / Self Care | Payer: Managed Care, Other (non HMO) | Source: Ambulatory Visit | Attending: Advanced Practice Midwife | Admitting: Advanced Practice Midwife

## 2013-09-11 ENCOUNTER — Other Ambulatory Visit (HOSPITAL_COMMUNITY): Payer: Self-pay | Admitting: Obstetrics and Gynecology

## 2013-09-11 VITALS — BP 132/83 | HR 62 | Wt 201.0 lb

## 2013-09-11 DIAGNOSIS — O219 Vomiting of pregnancy, unspecified: Secondary | ICD-10-CM

## 2013-09-11 DIAGNOSIS — O09299 Supervision of pregnancy with other poor reproductive or obstetric history, unspecified trimester: Secondary | ICD-10-CM | POA: Insufficient documentation

## 2013-09-11 DIAGNOSIS — Z98891 History of uterine scar from previous surgery: Secondary | ICD-10-CM

## 2013-09-11 DIAGNOSIS — O34219 Maternal care for unspecified type scar from previous cesarean delivery: Secondary | ICD-10-CM | POA: Insufficient documentation

## 2013-09-11 DIAGNOSIS — Z8751 Personal history of pre-term labor: Secondary | ICD-10-CM | POA: Insufficient documentation

## 2013-09-11 DIAGNOSIS — O09291 Supervision of pregnancy with other poor reproductive or obstetric history, first trimester: Secondary | ICD-10-CM

## 2013-09-11 DIAGNOSIS — Z3689 Encounter for other specified antenatal screening: Secondary | ICD-10-CM

## 2013-09-11 MED ORDER — VITAMIN B-6 100 MG PO TABS: 100.0000 mg | ORAL_TABLET | Freq: Every day | ORAL | Status: DC

## 2013-09-11 MED ORDER — ASPIRIN EC 81 MG PO TBEC: 81.0000 mg | DELAYED_RELEASE_TABLET | Freq: Every day | ORAL | Status: DC

## 2013-09-11 NOTE — Consult Note (Signed)
MFM Staff Consultation Note:  DISCUSSION:  By way of consultation, I spoke to Tina [name]  regarding her pregnancy history of severe preeclampsia/HELLP syndrome. I explained to her that her obstetrical history places her at increased risk for recurrence of preeclampsia. I explained the syndrome of preeclampsia as being unique to pregnancy and the associated clinical triad of increased blood pressure, proteinuria and abnormal edema. I also reviewed the underlying pathophysiology of preeclampsia as being rooted in endothelial dysfunction. I explained to her the increased vascular reactivity as well as the leaky endothelial lining of the blood vessels that create an environment of uteroplacental insufficiency with increased risk of intrauterine growth restriction, oligohydramnios and stillbirth. I cited at least a 25% recurrence risk if not higher to her for the spectrum of severe preeclampsia.    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  If PRES is ever diagnosed, not only will she need MgSO4 prophylaxis until 24 hours postpartum, but I additionally would recommend Keppra 500mg  po bid x 2 weeks replete with neurology consultation and follow up (please consult MFM for real time recommendations salient/unique to the actual presentation).  I reviewed her baseline labs, including a preeclampsia panel and 24-hour urine for total protein and creatinine clearance that indicates she does not appear to have evidence of preexisting renal injury (total protein 80mg /dl and reassuring creatinine clearance 239mL/min). If the patient were to develop signs and symptoms of preeclampsia in later pregnancy. I would additionally repeat the 24 hour urine collection for her along with  hepatic and renal function panel. I explained to her there is actually a preventive medicine to possibly reduce incidence of recurrence and subsequent pregnancy affected by severe preeclampsia namely this one. This medication is called low-dose aspirin 81 mg tablet taken daily. It is important that this preventive therapy be started prior to 20 weeks in this pregnancy (script given today for ASA 81mg  po qd, disp: #30, refill: 8) and preferably by 16 weeks in her next pregnancy.  Although normotensive technically at 132/83, I feel she may have some underlying component of vascular disease that predisposes her to [facility].  Today, her blood pressure is just below my threshold (in line with current standard of care) for treatment. If she ever becomes hypertensive, I recommend starting labetalol 200mg  po bid and titrating to efficacy 130-140's/80-90's (not lower to avoid overtreating CHTN and placing increased risk for IUGR). Accordingly, we discussed the fact that patients with chronic hypertension have a significantly increased risk of poor placentation, subsequent fetal growth delay, premature placental aging, placental abruption, preeclampsia, fetal distress, and early fetal death. This would modify the patient's management during pregnancy, which should then include a serial evaluation for fetal growth, and plans for antenatal testing on a twice-weekly basis from at least 32 weeks on.  However, she has yet to meet criteria for [facility], noting I suspect she will between now and 20 weeks.  Given her severity of previous presentation, I would recommend meticulous monitoring of her by way of examination in office with attention to optimal HTN control every 2 weeks until 32 weeks and then weekly thereafter to ensure close maternal monitoring. I counseled her on having a low threshold to present for evaluation in the context of symptoms of preeclampsia. She seemed to have an excellent grasp  of my recommendations. All of  your patient's questions were answered.  Lastly, we discussed nausea of pregnancy prevention and treatment for constipation.  I recommended she begin pyridoxine (vitamin b6) 100mg  po daily for nausea prevention and to increase frequency of colace 100mg  from q12 hours to q6-8 hours to titrate to soft stool.  She was also encouraged to ambulate frequently each day, hydrate herself (10-14 glasses of water/day), and consider adding metamucil OTC for her bowel regimen.  Impressions:  1. H/o severe preeclampsia/HELLP syndrome  2. Borderline HTN  3. Nausea  4. Constipation  SUMMARY OF RECOMMENDATIONS:  1.Initiation of aspirin 81 mg orally daily to help prevention of preeclampsia, intrauterine growth restriction and intrauterine fetal demise.  2. I would repeat the 24 hour urine and baseline preeclampsia labs again if she should ever present with signs or symptoms to prompt a workup for suspected preeclampsia.  3. First trimester screen 2 weeks from today  4.Detailed anatomic survey was arranged within 8 weeks of today.  5. Close monitoring of maternal status as outpatient every 2 weeks until 34 weeks and then weekly thereafter, noting that this will help detection of preeclampsia in early stages to prompt admission and management as clinically appropriate.  6. Pyridoxine 100mg  po daily  7. Colace 100mg  po q6-8 hours.  8. Metamucil OTC as directed  9. Strict maternal counseling with low threshold for presentation to be evaluated in context of signs/symptoms of preeclampsia.  Level III  I spent in excess of 40 minutes in review of medical records, evaluation, and education of your patient in consultation. More than 50% of this time was spent in direct face-to-face counseling. It was a pleasure seeing your patient today in consultation. Thank you for allowing [place] the opportunity to contribute to the care of your patient.  Page with questions.  Merideth Abbey, MD, MS, [facility]

## 2013-09-25 ENCOUNTER — Other Ambulatory Visit: Payer: Self-pay | Admitting: Advanced Practice Midwife

## 2013-09-25 DIAGNOSIS — O09292 Supervision of pregnancy with other poor reproductive or obstetric history, second trimester: Secondary | ICD-10-CM

## 2013-09-28 ENCOUNTER — Ambulatory Visit (HOSPITAL_COMMUNITY): Payer: Managed Care, Other (non HMO)

## 2013-10-02 ENCOUNTER — Ambulatory Visit (INDEPENDENT_AMBULATORY_CARE_PROVIDER_SITE_OTHER): Payer: Managed Care, Other (non HMO) | Admitting: Advanced Practice Midwife

## 2013-10-02 VITALS — BP 123/81 | Temp 99.4°F | Wt 201.0 lb

## 2013-10-02 DIAGNOSIS — O26899 Other specified pregnancy related conditions, unspecified trimester: Secondary | ICD-10-CM

## 2013-10-02 DIAGNOSIS — Z3480 Encounter for supervision of other normal pregnancy, unspecified trimester: Secondary | ICD-10-CM

## 2013-10-02 DIAGNOSIS — Z3481 Encounter for supervision of other normal pregnancy, first trimester: Secondary | ICD-10-CM

## 2013-10-02 DIAGNOSIS — R12 Heartburn: Secondary | ICD-10-CM

## 2013-10-02 DIAGNOSIS — O09299 Supervision of pregnancy with other poor reproductive or obstetric history, unspecified trimester: Secondary | ICD-10-CM

## 2013-10-02 LAB — POCT URINALYSIS DIPSTICK
Blood, UA: NEGATIVE
Glucose, UA: NEGATIVE
Ketones, UA: NEGATIVE
Spec Grav, UA: 1.015
pH, UA: 6

## 2013-10-02 MED ORDER — RANITIDINE HCL 150 MG PO CAPS: 150.0000 mg | ORAL_CAPSULE | Freq: Two times a day (BID) | ORAL | Status: DC

## 2013-10-02 NOTE — Progress Notes (Signed)
P 80 Patient reports her nausea is better, but now she has reflux.

## 2013-10-02 NOTE — Progress Notes (Signed)
Subjective: Tina [name] is a 32 y.o. at 13 weeks by early ultrasound  Patient denies vaginal leaking of fluid or bleeding, denies contractions.  Reports positive fetal movment.  Denies concerns today.  Objective: Filed Vitals:   10/02/13 1008  BP: 123/81  Temp: 99.4 F (37.4 C)   160 FHR SP Fundal Height Fetal Position unknown  Assessment: Patient Active Problem List   Diagnosis Date Noted  . Heartburn in pregnancy 10/02/2013  . Hx of HELLP syndrome, currently pregnant 08/21/2013  . H/O: C-section 08/21/2013  . Nausea and vomiting in pregnancy 08/21/2013  . High risk pregnancy due to history of previous obstetrical problem in first trimester 08/21/2013    Plan: Patient to return to clinic in 2 weeks Patient to RTC every 2 weeks. See MFM note for Management Recommend patient alternate and see the MDs occasionally throughout the pregnancy due to high risk. UA each visit. Patient to cont on Baby Aspirin. Patient declined genetic testing at this time.  Has 18 week [place] scheduled. Reviewed warning signs in pregnancy. Patient to call with concerns PRN. Reviewed triage location. Reviewed comfort measures for heartburn. Zantac 150 BID   20 min spent with patient greater than 80% spent in counseling and coordination of care.

## 2013-10-16 ENCOUNTER — Encounter: Payer: Managed Care, Other (non HMO) | Admitting: Advanced Practice Midwife

## 2013-10-23 ENCOUNTER — Ambulatory Visit (INDEPENDENT_AMBULATORY_CARE_PROVIDER_SITE_OTHER): Payer: Managed Care, Other (non HMO) | Admitting: Advanced Practice Midwife

## 2013-10-23 VITALS — BP 122/78 | Temp 98.4°F | Wt 201.0 lb

## 2013-10-23 DIAGNOSIS — Z3482 Encounter for supervision of other normal pregnancy, second trimester: Secondary | ICD-10-CM

## 2013-10-23 DIAGNOSIS — Z3480 Encounter for supervision of other normal pregnancy, unspecified trimester: Secondary | ICD-10-CM

## 2013-10-23 DIAGNOSIS — J029 Acute pharyngitis, unspecified: Secondary | ICD-10-CM

## 2013-10-23 LAB — POCT URINALYSIS DIPSTICK
Protein, UA: NEGATIVE
Spec Grav, UA: 1.025
Urobilinogen, UA: NEGATIVE
pH, UA: 5

## 2013-10-23 NOTE — Progress Notes (Signed)
Pulse 80 Pt states that she has had some cramping yesterday with headache.  Pt presents today with loss of voice, sore throat, and congestion.

## 2013-10-23 NOTE — Progress Notes (Signed)
Routine Obstetrical Visit  Subjective:    Tina [name] is being seen today for her routine obstetrical visit. She is at [redacted]w[redacted]d gestation.   Patient reports sinus pain and cold as well as sore throat. Patient denies SOB, fever, cough or expectorant.   Objective:     BP 122/78  Temp(Src) 98.4 F (36.9 C)  Wt 201 lb (91.173 kg)  LMP 06/30/2013 Physical Exam  Exam FHR 140 Physical Examination: General appearance - alert, well appearing, and in no distress Physical Examination: Mouth - mucous membranes moist, pharynx normal without lesions, tonsils normal, tongue normal and throat culture obtained     Assessment:    Pregnancy: G2P0101 Patient Active Problem List   Diagnosis Date Noted  . Heartburn in pregnancy 10/02/2013  . Hx of HELLP syndrome, currently pregnant 08/21/2013  . H/O: C-section 08/21/2013  . Nausea and vomiting in pregnancy 08/21/2013  . High risk pregnancy due to history of previous obstetrical problem in first trimester 08/21/2013      Sore Throat Plan:     Prenatal vitamins. Problem list reviewed and updated. Patient's [place] is shceduled Jan 12th.   Rapid strep negative, culture pending. Reviewed OTC therapy for sore throat.. Follow up in 2 weeks. 80% of 20 min visit spent on counseling and coordination of care.     Tina [name] 10/23/2013

## 2013-10-24 LAB — STREP A DNA PROBE: GASP: NEGATIVE

## 2013-10-29 NOTE — Progress Notes (Signed)
error 

## 2013-11-05 ENCOUNTER — Encounter: Payer: Self-pay | Admitting: Advanced Practice Midwife

## 2013-11-05 ENCOUNTER — Encounter: Payer: Self-pay | Admitting: Obstetrics & Gynecology

## 2013-11-05 ENCOUNTER — Encounter (HOSPITAL_COMMUNITY): Payer: Self-pay

## 2013-11-05 ENCOUNTER — Ambulatory Visit (HOSPITAL_COMMUNITY)
Admission: RE | Admit: 2013-11-05 | Discharge: 2013-11-05 | Disposition: A | Discharge status: Home / Self Care | Payer: Managed Care, Other (non HMO) | Source: Ambulatory Visit | Attending: Advanced Practice Midwife | Admitting: Advanced Practice Midwife

## 2013-11-05 DIAGNOSIS — O09299 Supervision of pregnancy with other poor reproductive or obstetric history, unspecified trimester: Secondary | ICD-10-CM | POA: Insufficient documentation

## 2013-11-05 DIAGNOSIS — Z8751 Personal history of pre-term labor: Secondary | ICD-10-CM | POA: Insufficient documentation

## 2013-11-05 DIAGNOSIS — O358XX0 Maternal care for other (suspected) fetal abnormality and damage, not applicable or unspecified: Secondary | ICD-10-CM | POA: Insufficient documentation

## 2013-11-05 DIAGNOSIS — O34219 Maternal care for unspecified type scar from previous cesarean delivery: Secondary | ICD-10-CM | POA: Insufficient documentation

## 2013-11-05 DIAGNOSIS — O09292 Supervision of pregnancy with other poor reproductive or obstetric history, second trimester: Secondary | ICD-10-CM

## 2013-11-05 LAB — US OB DETAIL + 14 WK

## 2013-11-06 ENCOUNTER — Ambulatory Visit (INDEPENDENT_AMBULATORY_CARE_PROVIDER_SITE_OTHER): Payer: Managed Care, Other (non HMO) | Admitting: Advanced Practice Midwife

## 2013-11-06 VITALS — BP 131/77 | Temp 97.1°F | Wt 206.0 lb

## 2013-11-06 DIAGNOSIS — Z3480 Encounter for supervision of other normal pregnancy, unspecified trimester: Secondary | ICD-10-CM

## 2013-11-06 LAB — POCT URINALYSIS DIPSTICK
Bilirubin, UA: NEGATIVE
Blood, UA: NEGATIVE
GLUCOSE UA: NEGATIVE
KETONES UA: NEGATIVE
Leukocytes, UA: NEGATIVE
Nitrite, UA: NEGATIVE
Protein, UA: NEGATIVE
SPEC GRAV UA: 1.025
Urobilinogen, UA: NEGATIVE
pH, UA: 5

## 2013-11-06 NOTE — Progress Notes (Signed)
Subjective: Tina [name] is a 33 y.o. at 18 weeks by early ultrasound  Patient denies vaginal leaking of fluid or bleeding, denies contractions.  Reports positive fetal movment.  Denies concerns today. Denies HA, RUQ pain or vision changes. Reports is having some anxiety as the pregnancy progresses due to last delivery.   Objective: Filed Vitals:   11/06/13 0934  BP: 131/77  Temp: 97.1 F (36.2 C)   150 FHR 2 below U Fundal Height Fetal Position unknown  Urine dipstick shows negative for all components. Dehydration.    Assessment: Patient Active Problem List   Diagnosis Date Noted  . Heartburn in pregnancy 10/02/2013  . Hx of HELLP syndrome, currently pregnant 08/21/2013  . H/O: C-section 08/21/2013  . Nausea and vomiting in pregnancy 08/21/2013  . High risk pregnancy due to history of previous obstetrical problem in first trimester 08/21/2013    Plan: Patient to return to clinic in 2 weeks. Cont every 2 weeks for UA and BP check.  Patient given handout on Pre-eclampsia and HELLP. Reviewed warning signs today. Tina [name] has repeat US scheduled in 1 month. Reviewed last US w/ her today. Plan fetal monitoring starting after 32 weeks.  Reviewed warning signs in pregnancy. Patient to call with concerns PRN. Reviewed triage location. Have patient meet w/ MDs throughout the pregnancy at upcoming visits. Plan CBC, 2 hour GCT and repeat PIH labs b/t 24-26 weeks.   20 min spent with patient greater than 80% spent in counseling and coordination of care.   Rawlin Reaume Wilson SingerWren CNM

## 2013-11-06 NOTE — Progress Notes (Signed)
P- 92 Patient states she is having no problems. Patient states she has no concerns.

## 2013-11-20 ENCOUNTER — Encounter: Payer: Managed Care, Other (non HMO) | Admitting: Advanced Practice Midwife

## 2013-12-04 ENCOUNTER — Other Ambulatory Visit: Payer: Self-pay | Admitting: Advanced Practice Midwife

## 2013-12-04 DIAGNOSIS — O09299 Supervision of pregnancy with other poor reproductive or obstetric history, unspecified trimester: Secondary | ICD-10-CM

## 2013-12-05 ENCOUNTER — Ambulatory Visit (HOSPITAL_COMMUNITY)
Admission: RE | Admit: 2013-12-05 | Discharge: 2013-12-05 | Disposition: A | Discharge status: Home / Self Care | Payer: Managed Care, Other (non HMO) | Source: Ambulatory Visit | Attending: Advanced Practice Midwife | Admitting: Advanced Practice Midwife

## 2013-12-05 VITALS — BP 123/75 | HR 86 | Wt 209.5 lb

## 2013-12-05 DIAGNOSIS — Z8751 Personal history of pre-term labor: Secondary | ICD-10-CM | POA: Insufficient documentation

## 2013-12-05 DIAGNOSIS — O34219 Maternal care for unspecified type scar from previous cesarean delivery: Secondary | ICD-10-CM | POA: Insufficient documentation

## 2013-12-05 DIAGNOSIS — O09299 Supervision of pregnancy with other poor reproductive or obstetric history, unspecified trimester: Secondary | ICD-10-CM

## 2013-12-05 DIAGNOSIS — Z3689 Encounter for other specified antenatal screening: Secondary | ICD-10-CM | POA: Insufficient documentation

## 2013-12-05 DIAGNOSIS — Z98891 History of uterine scar from previous surgery: Secondary | ICD-10-CM

## 2013-12-05 DIAGNOSIS — O219 Vomiting of pregnancy, unspecified: Secondary | ICD-10-CM

## 2013-12-05 DIAGNOSIS — O09291 Supervision of pregnancy with other poor reproductive or obstetric history, first trimester: Secondary | ICD-10-CM

## 2013-12-13 ENCOUNTER — Encounter: Payer: Managed Care, Other (non HMO) | Admitting: Obstetrics

## 2013-12-19 ENCOUNTER — Encounter: Payer: Self-pay | Admitting: Obstetrics

## 2013-12-19 ENCOUNTER — Ambulatory Visit (INDEPENDENT_AMBULATORY_CARE_PROVIDER_SITE_OTHER): Payer: Managed Care, Other (non HMO) | Admitting: Obstetrics

## 2013-12-19 VITALS — BP 115/80 | HR 98 | Temp 98.0°F | Wt 214.0 lb

## 2013-12-19 DIAGNOSIS — B9689 Other specified bacterial agents as the cause of diseases classified elsewhere: Secondary | ICD-10-CM | POA: Insufficient documentation

## 2013-12-19 DIAGNOSIS — A499 Bacterial infection, unspecified: Secondary | ICD-10-CM

## 2013-12-19 DIAGNOSIS — O0990 Supervision of high risk pregnancy, unspecified, unspecified trimester: Secondary | ICD-10-CM | POA: Insufficient documentation

## 2013-12-19 DIAGNOSIS — Z3480 Encounter for supervision of other normal pregnancy, unspecified trimester: Secondary | ICD-10-CM

## 2013-12-19 DIAGNOSIS — J329 Chronic sinusitis, unspecified: Secondary | ICD-10-CM

## 2013-12-19 LAB — POCT URINALYSIS DIPSTICK
Bilirubin, UA: NEGATIVE
Glucose, UA: NEGATIVE
Ketones, UA: NEGATIVE
Leukocytes, UA: NEGATIVE
Nitrite, UA: NEGATIVE
PH UA: 6
RBC UA: NEGATIVE
SPEC GRAV UA: 1.02
Urobilinogen, UA: NEGATIVE

## 2013-12-19 MED ORDER — AZITHROMYCIN 250 MG PO TABS: ORAL_TABLET | ORAL | Status: DC

## 2013-12-19 NOTE — Progress Notes (Signed)
Edema: None  Movement: Present  Pain/Pressure: Absent  Contractions: Not Present  Vaginal Bleeding: None   Patient states she does not feel well. Patient states her throat is burning. Patient states she is having nasal congestion and a slight cough more at night. Patient states she is prone to getting sinus infections and that it is what it kind of feels like.   A/P:  Sinusitis.  Z - Pak Rx.

## 2013-12-28 ENCOUNTER — Ambulatory Visit (INDEPENDENT_AMBULATORY_CARE_PROVIDER_SITE_OTHER): Payer: Managed Care, Other (non HMO) | Admitting: Advanced Practice Midwife

## 2013-12-28 ENCOUNTER — Other Ambulatory Visit: Payer: Self-pay | Admitting: Advanced Practice Midwife

## 2013-12-28 ENCOUNTER — Other Ambulatory Visit: Payer: Managed Care, Other (non HMO)

## 2013-12-28 VITALS — BP 111/76 | Temp 97.8°F | Wt 214.0 lb

## 2013-12-28 DIAGNOSIS — Z3480 Encounter for supervision of other normal pregnancy, unspecified trimester: Secondary | ICD-10-CM

## 2013-12-28 DIAGNOSIS — O09299 Supervision of pregnancy with other poor reproductive or obstetric history, unspecified trimester: Secondary | ICD-10-CM

## 2013-12-28 LAB — POCT URINALYSIS DIPSTICK
Bilirubin, UA: NEGATIVE
Blood, UA: NEGATIVE
Glucose, UA: NEGATIVE
KETONES UA: NEGATIVE
Nitrite, UA: NEGATIVE
PH UA: 6
Protein, UA: NEGATIVE
SPEC GRAV UA: 1.015
Urobilinogen, UA: NEGATIVE

## 2013-12-28 LAB — CBC
HCT: 34.8 % — ABNORMAL LOW (ref 36.0–46.0)
Hemoglobin: 11.8 g/dL — ABNORMAL LOW (ref 12.0–15.0)
MCH: 31.9 pg (ref 26.0–34.0)
MCHC: 33.9 g/dL (ref 30.0–36.0)
MCV: 94.1 fL (ref 78.0–100.0)
PLATELETS: 250 10*3/uL (ref 150–400)
RBC: 3.7 MIL/uL — ABNORMAL LOW (ref 3.87–5.11)
RDW: 13.7 % (ref 11.5–15.5)
WBC: 12.6 10*3/uL — AB (ref 4.0–10.5)

## 2013-12-28 NOTE — Progress Notes (Signed)
Pulse: 94 Patient denies any concerns.  

## 2013-12-28 NOTE — Progress Notes (Signed)
Subjective: Tina [name] is a 33 y.o. at 26 weeks by early ultrasound  Patient denies vaginal leaking of fluid or bleeding, denies contractions.  Reports positive fetal movment.  Denies concerns today.  Objective: Filed Vitals:   12/28/13 0929  BP: 111/76  Temp: 97.8 F (36.6 C)   150 FHR 26 Fundal Height Fetal Position unknown  Assessment: Patient Active Problem List   Diagnosis Date Noted  . Unspecified high-risk pregnancy 12/19/2013  . Sinusitis, bacterial 12/19/2013  . Heartburn in pregnancy 10/02/2013  . Hx of HELLP syndrome, currently pregnant 08/21/2013  . H/O: C-section 08/21/2013  . Nausea and vomiting in pregnancy 08/21/2013  . High risk pregnancy due to history of previous obstetrical problem in first trimester 08/21/2013    Plan: Patient to return to clinic in 2 weeks PIH labs, GCT today. Reviewed US, plan repeat, scheduled today. Reviewed warning signs in pregnancy. Patient to call with concerns PRN. Reviewed triage location. Symptoms improved from sinus infection. Has Rx for belly band.  20 min spent with patient greater than 80% spent in counseling and coordination of care.   Latavion Halls Wilson SingerWren CNM

## 2013-12-29 LAB — COMPREHENSIVE METABOLIC PANEL
ALT: 19 U/L (ref 0–35)
AST: 18 U/L (ref 0–37)
Albumin: 3.5 g/dL (ref 3.5–5.2)
Alkaline Phosphatase: 97 U/L (ref 39–117)
BILIRUBIN TOTAL: 0.3 mg/dL (ref 0.2–1.2)
BUN: 7 mg/dL (ref 6–23)
CALCIUM: 9.4 mg/dL (ref 8.4–10.5)
CO2: 21 meq/L (ref 19–32)
CREATININE: 0.56 mg/dL (ref 0.50–1.10)
Chloride: 104 mEq/L (ref 96–112)
GLUCOSE: 67 mg/dL — AB (ref 70–99)
Potassium: 3.8 mEq/L (ref 3.5–5.3)
SODIUM: 139 meq/L (ref 135–145)
TOTAL PROTEIN: 6.4 g/dL (ref 6.0–8.3)

## 2013-12-29 LAB — GLUCOSE TOLERANCE, 2 HOURS W/ 1HR
GLUCOSE, FASTING: 69 mg/dL — AB (ref 70–99)
Glucose, 1 hour: 163 mg/dL (ref 70–170)
Glucose, 2 hour: 111 mg/dL (ref 70–139)

## 2013-12-29 LAB — PROTEIN / CREATININE RATIO, URINE
Creatinine, Urine: 105.6 mg/dL
Protein Creatinine Ratio: 0.05 (ref <0.15)
Total Protein, Urine: 5 mg/dL

## 2013-12-29 LAB — RPR

## 2013-12-29 LAB — HIV ANTIBODY (ROUTINE TESTING W REFLEX): HIV: NONREACTIVE

## 2013-12-29 LAB — LACTATE DEHYDROGENASE: LDH: 192 U/L (ref 94–250)

## 2014-01-07 DIAGNOSIS — Z3480 Encounter for supervision of other normal pregnancy, unspecified trimester: Secondary | ICD-10-CM

## 2014-01-09 ENCOUNTER — Encounter: Payer: Managed Care, Other (non HMO) | Admitting: Obstetrics

## 2014-01-11 ENCOUNTER — Encounter: Payer: Managed Care, Other (non HMO) | Admitting: Advanced Practice Midwife

## 2014-01-22 ENCOUNTER — Encounter: Payer: Managed Care, Other (non HMO) | Admitting: Obstetrics

## 2014-01-24 ENCOUNTER — Ambulatory Visit (HOSPITAL_COMMUNITY): Payer: Managed Care, Other (non HMO)

## 2014-01-25 ENCOUNTER — Other Ambulatory Visit: Payer: Self-pay | Admitting: Advanced Practice Midwife

## 2014-01-25 DIAGNOSIS — O09299 Supervision of pregnancy with other poor reproductive or obstetric history, unspecified trimester: Secondary | ICD-10-CM

## 2014-01-25 DIAGNOSIS — Z8751 Personal history of pre-term labor: Secondary | ICD-10-CM

## 2014-01-25 DIAGNOSIS — O3421 Maternal care for scar from previous cesarean delivery: Secondary | ICD-10-CM

## 2014-01-29 ENCOUNTER — Ambulatory Visit (HOSPITAL_COMMUNITY)
Admission: RE | Admit: 2014-01-29 | Discharge: 2014-01-29 | Disposition: A | Discharge status: Home / Self Care | Payer: Managed Care, Other (non HMO) | Source: Ambulatory Visit | Attending: Advanced Practice Midwife | Admitting: Advanced Practice Midwife

## 2014-01-29 DIAGNOSIS — Z8751 Personal history of pre-term labor: Secondary | ICD-10-CM | POA: Insufficient documentation

## 2014-01-29 DIAGNOSIS — O09299 Supervision of pregnancy with other poor reproductive or obstetric history, unspecified trimester: Secondary | ICD-10-CM | POA: Insufficient documentation

## 2014-01-29 DIAGNOSIS — O34219 Maternal care for unspecified type scar from previous cesarean delivery: Secondary | ICD-10-CM | POA: Insufficient documentation

## 2014-01-29 DIAGNOSIS — O3421 Maternal care for scar from previous cesarean delivery: Secondary | ICD-10-CM

## 2014-02-05 ENCOUNTER — Ambulatory Visit (INDEPENDENT_AMBULATORY_CARE_PROVIDER_SITE_OTHER): Payer: Managed Care, Other (non HMO) | Admitting: Obstetrics

## 2014-02-05 VITALS — BP 121/81 | Temp 98.1°F | Wt 220.0 lb

## 2014-02-05 DIAGNOSIS — Z3480 Encounter for supervision of other normal pregnancy, unspecified trimester: Secondary | ICD-10-CM

## 2014-02-05 LAB — POCT URINALYSIS DIPSTICK
BILIRUBIN UA: NEGATIVE
Blood, UA: NEGATIVE
GLUCOSE UA: NEGATIVE
Nitrite, UA: NEGATIVE
Spec Grav, UA: 1.02
Urobilinogen, UA: NEGATIVE
pH, UA: 5

## 2014-02-05 NOTE — Progress Notes (Signed)
Pulse 96 Pt is doing well

## 2014-02-07 ENCOUNTER — Encounter: Payer: Self-pay | Admitting: Obstetrics

## 2014-02-14 ENCOUNTER — Encounter (HOSPITAL_COMMUNITY): Payer: Self-pay | Admitting: General Practice

## 2014-02-14 ENCOUNTER — Inpatient Hospital Stay (HOSPITAL_COMMUNITY)
Admission: AD | Admit: 2014-02-14 | Discharge: 2014-02-14 | Disposition: A | Discharge status: Home / Self Care | Payer: Managed Care, Other (non HMO) | Source: Ambulatory Visit | Attending: Obstetrics & Gynecology | Admitting: Obstetrics & Gynecology

## 2014-02-14 DIAGNOSIS — Z98891 History of uterine scar from previous surgery: Secondary | ICD-10-CM

## 2014-02-14 DIAGNOSIS — O26899 Other specified pregnancy related conditions, unspecified trimester: Secondary | ICD-10-CM

## 2014-02-14 DIAGNOSIS — O09299 Supervision of pregnancy with other poor reproductive or obstetric history, unspecified trimester: Secondary | ICD-10-CM

## 2014-02-14 DIAGNOSIS — M549 Dorsalgia, unspecified: Secondary | ICD-10-CM | POA: Insufficient documentation

## 2014-02-14 DIAGNOSIS — R51 Headache: Secondary | ICD-10-CM

## 2014-02-14 DIAGNOSIS — O09291 Supervision of pregnancy with other poor reproductive or obstetric history, first trimester: Secondary | ICD-10-CM

## 2014-02-14 DIAGNOSIS — O99891 Other specified diseases and conditions complicating pregnancy: Secondary | ICD-10-CM | POA: Insufficient documentation

## 2014-02-14 DIAGNOSIS — IMO0002 Reserved for concepts with insufficient information to code with codable children: Secondary | ICD-10-CM | POA: Insufficient documentation

## 2014-02-14 DIAGNOSIS — O09899 Supervision of other high risk pregnancies, unspecified trimester: Secondary | ICD-10-CM | POA: Insufficient documentation

## 2014-02-14 DIAGNOSIS — O212 Late vomiting of pregnancy: Secondary | ICD-10-CM | POA: Insufficient documentation

## 2014-02-14 DIAGNOSIS — O9989 Other specified diseases and conditions complicating pregnancy, childbirth and the puerperium: Principal | ICD-10-CM

## 2014-02-14 DIAGNOSIS — O219 Vomiting of pregnancy, unspecified: Secondary | ICD-10-CM

## 2014-02-14 LAB — URINALYSIS, ROUTINE W REFLEX MICROSCOPIC
Bilirubin Urine: NEGATIVE
Glucose, UA: NEGATIVE mg/dL
Hgb urine dipstick: NEGATIVE
Ketones, ur: NEGATIVE mg/dL
LEUKOCYTES UA: NEGATIVE
NITRITE: NEGATIVE
PH: 7 (ref 5.0–8.0)
Protein, ur: NEGATIVE mg/dL
Specific Gravity, Urine: 1.02 (ref 1.005–1.030)
Urobilinogen, UA: 0.2 mg/dL (ref 0.0–1.0)

## 2014-02-14 MED ORDER — CYCLOBENZAPRINE HCL 10 MG PO TABS: 10.0000 mg | ORAL_TABLET | Freq: Three times a day (TID) | ORAL | Status: DC | PRN

## 2014-02-14 MED ORDER — BUTALBITAL-APAP-CAFFEINE 50-325-40 MG PO TABS
2.0000 | ORAL_TABLET | Freq: Once | ORAL | Status: AC
  Administered 2014-02-14: 2 via ORAL
  Filled 2014-02-14: qty 2

## 2014-02-14 NOTE — MAU Note (Signed)
Patient states she has started having headaches that are getting worse. Has had increase in swelling in  Feet, legs and hands. Has been having back pain for a couple weeks. Patient has had a previous cesarean and will be for a repeat, no date scheduled yet. Denies contractions, leaking or bleeding and reports good fetal movement. Patient states she had severe preeclampsia with last pregnancy.

## 2014-02-14 NOTE — Discharge Instructions (Signed)
Preeclampsia and Eclampsia °Preeclampsia is a condition of high blood pressure during pregnancy. It can happen at 20 weeks or later in pregnancy. If high blood pressure occurs in the second half of pregnancy with no other symptoms, it is called gestational hypertension and goes away after the baby is born. If any of the symptoms listed below develop with gestational hypertension, it is then called preeclampsia. Eclampsia (convulsions) may follow preeclampsia. This is one of the reasons for regular prenatal checkups. Early diagnosis and treatment are very important to prevent eclampsia. °CAUSES  °There is no known cause of preeclampsia/eclampsia in pregnancy. There are several known conditions that may put the pregnant woman at risk, such as: °· The first pregnancy. °· Having preeclampsia in a past pregnancy. °· Having lasting (chronic) high blood pressure. °· Having multiples (twins, triplets). °· Being age 35 or older. °· African American ethnic background. °· Having kidney disease or diabetes. °· Medical conditions such as lupus or blood diseases. °· Being overweight (obese). °SYMPTOMS  °· High blood pressure. °· Headaches. °· Sudden weight gain. °· Swelling of hands, face, legs, and feet. °· Protein in the urine. °· Feeling sick to your stomach (nauseous) and throwing up (vomiting). °· Vision problems (blurred or double vision). °· Numbness in the face, arms, legs, and feet. °· Dizziness. °· Slurred speech. °· Preeclampsia can cause growth retardation in the fetus. °· Separation (abruption) of the placenta. °· Not enough fluid in the amniotic sac (oligohydramnios). °· Sensitivity to bright lights. °· Belly (abdominal) pain. °DIAGNOSIS  °If protein is found in the urine in the second half of pregnancy, this is considered preeclampsia. Other symptoms mentioned above may also be present. °TREATMENT  °It is necessary to treat this. °· Your caregiver may prescribe bed rest early in this condition. Plenty of rest and  salt restriction may be all that is needed. °· Medicines may be necessary to lower blood pressure if the condition does not respond to more conservative measures. °· In more severe cases, hospitalization may be needed: °· For treatment of blood pressure. °· To control fluid retention. °· To monitor the baby to see if the condition is causing harm to the baby. °· Hospitalization is the best way to treat the first sign of preeclampsia. This is so the mother and baby can be watched closely and blood tests can be done effectively and correctly. °· If the condition becomes severe, it may be necessary to induce labor or to remove the infant by surgical means (cesarean section). The best cure for preeclampsia/eclampsia is to deliver the baby. °Preeclampsia and eclampsia involve risks to mother and infant. Your caregiver will discuss these risks with you. Together, you can work out the best possible approach to your problems. Make sure you keep your prenatal visits as scheduled. Not keeping appointments could result in a chronic or permanent injury, pain, disability to you, and death or injury to you or your unborn baby. If there is any problem keeping the appointment, you must call to reschedule. °HOME CARE INSTRUCTIONS  °· Keep your prenatal appointments and tests as scheduled. °· Tell your caregiver if you have any of the above risk factors. °· Get plenty of rest and sleep. °· Eat a balanced diet that is low in salt, and do not add salt to your food. °· Avoid stressful situations. °· Only take over-the-counter and prescriptions medicines for pain, discomfort, or fever as directed by your caregiver. °SEEK IMMEDIATE MEDICAL CARE IF:  °· You develop severe swelling   anywhere in the body. This usually occurs in the legs. °· You gain 05 lb/2.3 kg or more in a week. °· You develop a severe headache, dizziness, problems with your vision, or confusion. °· You have abdominal pain, nausea, or vomiting. °· You have a seizure. °· You  have trouble moving any part of your body, or you develop numbness or problems speaking. °· You have bruising or abnormal bleeding from anywhere in the body. °· You develop a stiff neck. °· You pass out. °MAKE SURE YOU:  °· Understand these instructions. °· Will watch your condition. °· Will get help right away if you are not doing well or get worse. °Document Released: 10/08/2000 Document Revised: 01/03/2012 Document Reviewed: 05/24/2008 °ExitCare® Patient Information ©2014 [employer]. ° °

## 2014-02-14 NOTE — MAU Provider Note (Signed)
History     CSN: [id number]  Arrival date and time: 02/14/14 1318   First Provider Initiated Contact with Patient 02/14/14 1400      Chief Complaint  Patient presents with  . Headache  . Leg Swelling  . Back Pain   Headache  Associated symptoms include back pain.  Back Pain Associated symptoms include headaches.    Tina [name] is a 33 y.o. G2P0101 at 3268w1d who presents today with a headache and swelling. She has a HX of Pre-eclampsia and PRES with her last pregnancy. She has not had any concerning symptoms until last night when she developed the headache. She has not taken anything for her headache at this time. She denies any visual disturbances or RUQ pain. She denies any vaginal bleeding or LOF at this time. She states that the baby has been moving normally.   Past Medical History  Diagnosis Date  . Pre-eclampsia     Past Surgical History  Procedure Laterality Date  . Cesarean section      Family History  Problem Relation Age of Onset  . Cancer Father   . Cancer Maternal Grandfather   . Cancer Paternal Grandfather     History  Substance Use Topics  . Smoking status: Never Smoker   . Smokeless tobacco: Never Used  . Alcohol Use: No    Allergies: No Known Allergies  Prescriptions prior to admission  Medication Sig Dispense Refill  . aspirin EC 81 MG tablet Take 1 tablet (81 mg total) by mouth daily.  30 tablet  8  . ranitidine (ZANTAC) 150 MG capsule Take 1 capsule (150 mg total) by mouth 2 (two) times daily.  60 capsule  1    Review of Systems  Musculoskeletal: Positive for back pain.  Neurological: Positive for headaches.   Physical Exam   Blood pressure 130/77, pulse 92, temperature 99 F (37.2 C), temperature source Oral, resp. rate 16, height 5' 3.5" (1.613 m), weight 101.651 kg (224 lb 1.6 oz), last menstrual period 06/30/2013, SpO2 99.00%.  Physical Exam  Nursing note and vitals reviewed. Constitutional: She is oriented to person, place, and  time. She appears well-developed and well-nourished. No distress.  Cardiovascular: Normal rate.   Respiratory: Effort normal.  GI: Soft. There is no tenderness.  Neurological: She is alert and oriented to person, place, and time. She has normal reflexes.  No Clonus   Skin: Skin is warm and dry.  Psychiatric: She has a normal mood and affect.   Cervix: Closed/thick/high  FHT: 140, moderate with accels, no decels Toco: occasional UI MAU Course  Procedures  Results for orders placed during the hospital encounter of 02/14/14 (from the past 24 hour(s))  URINALYSIS, ROUTINE W REFLEX MICROSCOPIC     Status: None   Collection Time    02/14/14  1:56 PM      Result Value Ref Range   Color, Urine YELLOW  YELLOW   APPearance CLEAR  CLEAR   Specific Gravity, Urine 1.020  1.005 - 1.030   pH 7.0  5.0 - 8.0   Glucose, UA NEGATIVE  NEGATIVE mg/dL   Hgb urine dipstick NEGATIVE  NEGATIVE   Bilirubin Urine NEGATIVE  NEGATIVE   Ketones, ur NEGATIVE  NEGATIVE mg/dL   Protein, ur NEGATIVE  NEGATIVE mg/dL   Urobilinogen, UA 0.2  0.0 - 1.0 mg/dL   Nitrite NEGATIVE  NEGATIVE   Leukocytes, UA NEGATIVE  NEGATIVE   1530: Patient reports headache has improved with Fioricet 1534: D/W Dr. Tamela OddiJackson-moore,  patient is ok for dc home.  Assessment and Plan   1. Hx of HELLP syndrome, currently pregnant   2. H/O: C-section   3. Nausea and vomiting in pregnancy   4. High risk pregnancy due to history of previous obstetrical problem in first trimester   5. Headache in pregnancy    Headache resolved with fiorcet here in MAU Pre-eclampsia danger signs reviewed Return to MAU as needed  Follow-up Information   [facility] (As scheduled)    Specialty:  Obstetrics and Gynecology   Contact information:   [address], [address] [place] [address] [phone]       Tawnya CrookHeather Donovan Hogan 02/14/2014, 2:06 PM

## 2014-02-19 ENCOUNTER — Encounter: Payer: Self-pay | Admitting: Obstetrics

## 2014-02-19 ENCOUNTER — Ambulatory Visit (INDEPENDENT_AMBULATORY_CARE_PROVIDER_SITE_OTHER): Payer: Managed Care, Other (non HMO) | Admitting: Obstetrics

## 2014-02-19 VITALS — BP 130/82 | HR 91 | Temp 98.1°F | Wt 226.0 lb

## 2014-02-19 DIAGNOSIS — G5600 Carpal tunnel syndrome, unspecified upper limb: Secondary | ICD-10-CM

## 2014-02-19 DIAGNOSIS — K219 Gastro-esophageal reflux disease without esophagitis: Secondary | ICD-10-CM

## 2014-02-19 DIAGNOSIS — G5603 Carpal tunnel syndrome, bilateral upper limbs: Secondary | ICD-10-CM | POA: Insufficient documentation

## 2014-02-19 DIAGNOSIS — Z3480 Encounter for supervision of other normal pregnancy, unspecified trimester: Secondary | ICD-10-CM

## 2014-02-19 LAB — POCT URINALYSIS DIPSTICK
BILIRUBIN UA: NEGATIVE
Blood, UA: NEGATIVE
Glucose, UA: NEGATIVE
Ketones, UA: NEGATIVE
Leukocytes, UA: NEGATIVE
NITRITE UA: NEGATIVE
Spec Grav, UA: 1.02
UROBILINOGEN UA: NEGATIVE
pH, UA: 6

## 2014-02-19 MED ORDER — OMEPRAZOLE 20 MG PO CPDR: 20.0000 mg | DELAYED_RELEASE_CAPSULE | Freq: Two times a day (BID) | ORAL | Status: DC

## 2014-02-19 NOTE — Progress Notes (Addendum)
Subjective:    Casandra Doffingmanda Nippert is a 33 y.o. female being seen today for her obstetrical visit. She is at 3248w6d gestation. Patient reports carpal tunnel symptoms, heartburn and occasional contractions. Fetal movement: normal.  Problem List Items Addressed This Visit   Carpal tunnel syndrome on both sides   GERD without esophagitis   Relevant Medications      omeprazole (PRILOSEC) capsule    Other Visit Diagnoses   Supervision of other normal pregnancy    -  Primary    Relevant Orders       POCT urinalysis dipstick (Completed)      Patient Active Problem List   Diagnosis Date Noted  . Carpal tunnel syndrome on both sides 02/19/2014  . GERD without esophagitis 02/19/2014  . Unspecified high-risk pregnancy 12/19/2013  . Sinusitis, bacterial 12/19/2013  . Heartburn in pregnancy 10/02/2013  . Hx of HELLP syndrome, currently pregnant 08/21/2013  . H/O: C-section 08/21/2013  . Nausea and vomiting in pregnancy 08/21/2013  . High risk pregnancy due to history of previous obstetrical problem in first trimester 08/21/2013   Objective:    BP 130/82  Pulse 91  Temp(Src) 98.1 F (36.7 C)  Wt 226 lb (102.513 kg)  LMP 06/30/2013 FHT:  150 BPM  Uterine Size: size equals dates  Presentation: unsure     Assessment:    Pregnancy @ 5948w6d weeks   Plan:     labs reviewed, problem list updated Consent signed. GBS sent TDAP offered  Rhogam given for RH negative Pediatrician: discussed. Infant feeding: plans to breastfeed. Maternity leave: discussed. Cigarette smoking: never smoked. Orders Placed This Encounter  Procedures  . POCT urinalysis dipstick   Meds ordered this encounter  Medications  . omeprazole (PRILOSEC) 20 MG capsule    Sig: Take 1 capsule (20 mg total) by mouth 2 (two) times daily before a meal.    Dispense:  60 capsule    Refill:  5   Follow up in 1 Week.

## 2014-03-04 ENCOUNTER — Inpatient Hospital Stay (HOSPITAL_COMMUNITY)
Admission: AD | Admit: 2014-03-04 | Discharge: 2014-03-05 | DRG: 778 | Disposition: A | Discharge status: Home / Self Care | Payer: Managed Care, Other (non HMO) | Source: Ambulatory Visit | Attending: Obstetrics | Admitting: Obstetrics

## 2014-03-04 ENCOUNTER — Encounter (HOSPITAL_COMMUNITY): Payer: Self-pay | Admitting: *Deleted

## 2014-03-04 DIAGNOSIS — O4700 False labor before 37 completed weeks of gestation, unspecified trimester: Principal | ICD-10-CM | POA: Diagnosis present

## 2014-03-04 DIAGNOSIS — O34219 Maternal care for unspecified type scar from previous cesarean delivery: Secondary | ICD-10-CM | POA: Diagnosis present

## 2014-03-04 LAB — COMPREHENSIVE METABOLIC PANEL
ALT: 13 U/L (ref 0–35)
AST: 20 U/L (ref 0–37)
Albumin: 2.7 g/dL — ABNORMAL LOW (ref 3.5–5.2)
Alkaline Phosphatase: 202 U/L — ABNORMAL HIGH (ref 39–117)
BUN: 9 mg/dL (ref 6–23)
CALCIUM: 9.5 mg/dL (ref 8.4–10.5)
CO2: 21 mEq/L (ref 19–32)
Chloride: 101 mEq/L (ref 96–112)
Creatinine, Ser: 0.68 mg/dL (ref 0.50–1.10)
GFR calc Af Amer: >90 mL/min (ref >90)
Glucose, Bld: 80 mg/dL (ref 70–99)
Potassium: 4.3 mEq/L (ref 3.7–5.3)
SODIUM: 138 meq/L (ref 137–147)
TOTAL PROTEIN: 6.4 g/dL (ref 6.0–8.3)
Total Bilirubin: 0.2 mg/dL — ABNORMAL LOW (ref 0.3–1.2)

## 2014-03-04 LAB — CBC
HCT: 34.1 % — ABNORMAL LOW (ref 36.0–46.0)
HEMOGLOBIN: 11.8 g/dL — AB (ref 12.0–15.0)
MCH: 32.1 pg (ref 26.0–34.0)
MCHC: 34.6 g/dL (ref 30.0–36.0)
MCV: 92.7 fL (ref 78.0–100.0)
Platelets: 200 10*3/uL (ref 150–400)
RBC: 3.68 MIL/uL — AB (ref 3.87–5.11)
RDW: 13.1 % (ref 11.5–15.5)
WBC: 13.8 10*3/uL — ABNORMAL HIGH (ref 4.0–10.5)

## 2014-03-04 LAB — URINALYSIS, ROUTINE W REFLEX MICROSCOPIC
Bilirubin Urine: NEGATIVE
Glucose, UA: NEGATIVE mg/dL
HGB URINE DIPSTICK: NEGATIVE
Ketones, ur: 15 mg/dL — AB
NITRITE: NEGATIVE
PROTEIN: 30 mg/dL — AB
Specific Gravity, Urine: 1.025 (ref 1.005–1.030)
UROBILINOGEN UA: 0.2 mg/dL (ref 0.0–1.0)
pH: 6 (ref 5.0–8.0)

## 2014-03-04 LAB — URINE MICROSCOPIC-ADD ON

## 2014-03-04 LAB — LACTATE DEHYDROGENASE: LDH: 188 U/L (ref 94–250)

## 2014-03-04 LAB — URIC ACID: URIC ACID, SERUM: 5.8 mg/dL (ref 2.4–7.0)

## 2014-03-04 MED ORDER — OXYTOCIN 10 UNIT/ML IJ SOLN
INTRAMUSCULAR | Status: AC
  Filled 2014-03-04: qty 4

## 2014-03-04 MED ORDER — MAGNESIUM SULFATE BOLUS VIA INFUSION
4.0000 g | Freq: Once | INTRAVENOUS | Status: AC
  Administered 2014-03-04: 4 g via INTRAVENOUS
  Filled 2014-03-04: qty 500

## 2014-03-04 MED ORDER — LACTATED RINGERS IV SOLN
INTRAVENOUS | Status: DC
  Administered 2014-03-04 – 2014-03-05 (×3): via INTRAVENOUS

## 2014-03-04 MED ORDER — ONDANSETRON HCL 4 MG/2ML IJ SOLN
4.0000 mg | Freq: Once | INTRAMUSCULAR | Status: AC
  Administered 2014-03-04: 4 mg via INTRAVENOUS
  Filled 2014-03-04: qty 2

## 2014-03-04 MED ORDER — NALBUPHINE HCL 10 MG/ML IJ SOLN: 10.0000 mg | Freq: Once | INTRAMUSCULAR | Status: DC

## 2014-03-04 MED ORDER — NALBUPHINE HCL 10 MG/ML IJ SOLN: 10.0000 mg | INTRAMUSCULAR | Status: DC | PRN

## 2014-03-04 MED ORDER — LACTATED RINGERS IV BOLUS (SEPSIS)
1000.0000 mL | Freq: Once | INTRAVENOUS | Status: AC
  Administered 2014-03-04: 1000 mL via INTRAVENOUS

## 2014-03-04 MED ORDER — MAGNESIUM SULFATE 40 G IN LACTATED RINGERS - SIMPLE
2.0000 g/h | INTRAVENOUS | Status: DC
  Administered 2014-03-04: 2 g/h via INTRAVENOUS
  Filled 2014-03-04: qty 500

## 2014-03-04 MED ORDER — ONDANSETRON 4 MG PO TBDP
4.0000 mg | ORAL_TABLET | Freq: Once | ORAL | Status: AC
  Administered 2014-03-04: 4 mg via ORAL
  Filled 2014-03-04: qty 1

## 2014-03-04 MED ORDER — ZOLPIDEM TARTRATE 5 MG PO TABS: 5.0000 mg | ORAL_TABLET | Freq: Every evening | ORAL | Status: DC | PRN

## 2014-03-04 MED ORDER — ONDANSETRON HCL 4 MG/2ML IJ SOLN
INTRAMUSCULAR | Status: AC
  Filled 2014-03-04: qty 2

## 2014-03-04 MED ORDER — TERBUTALINE SULFATE 1 MG/ML IJ SOLN
0.2500 mg | Freq: Once | INTRAMUSCULAR | Status: AC
  Administered 2014-03-04: 0.25 mg via SUBCUTANEOUS
  Filled 2014-03-04: qty 1

## 2014-03-04 MED ORDER — ACETAMINOPHEN 325 MG PO TABS
650.0000 mg | ORAL_TABLET | ORAL | Status: DC | PRN
  Administered 2014-03-05: 650 mg via ORAL
  Filled 2014-03-04: qty 2

## 2014-03-04 MED ORDER — PRENATAL MULTIVITAMIN CH: 1.0000 | ORAL_TABLET | Freq: Every day | ORAL | Status: DC

## 2014-03-04 MED ORDER — CALCIUM CARBONATE ANTACID 500 MG PO CHEW: 2.0000 | CHEWABLE_TABLET | ORAL | Status: DC | PRN

## 2014-03-04 MED ORDER — MORPHINE SULFATE 0.5 MG/ML IJ SOLN
INTRAMUSCULAR | Status: AC
  Filled 2014-03-04: qty 10

## 2014-03-04 MED ORDER — DOCUSATE SODIUM 100 MG PO CAPS: 100.0000 mg | ORAL_CAPSULE | Freq: Every day | ORAL | Status: DC

## 2014-03-04 MED ORDER — FENTANYL CITRATE 0.05 MG/ML IJ SOLN
INTRAMUSCULAR | Status: AC
  Filled 2014-03-04: qty 2

## 2014-03-04 MED ORDER — CEFAZOLIN SODIUM-DEXTROSE 2-3 GM-% IV SOLR
INTRAVENOUS | Status: AC
  Filled 2014-03-04: qty 50

## 2014-03-04 MED ORDER — NALBUPHINE HCL 10 MG/ML IJ SOLN
10.0000 mg | Freq: Once | INTRAMUSCULAR | Status: AC
  Administered 2014-03-04: 10 mg via INTRAVENOUS
  Filled 2014-03-04: qty 1

## 2014-03-04 MED ORDER — MEPERIDINE HCL 25 MG/ML IJ SOLN
INTRAMUSCULAR | Status: AC
  Filled 2014-03-04: qty 1

## 2014-03-04 MED ORDER — NALBUPHINE HCL 10 MG/ML IJ SOLN
10.0000 mg | INTRAMUSCULAR | Status: DC | PRN
  Administered 2014-03-04: 10 mg via INTRAVENOUS
  Filled 2014-03-04 (×2): qty 1

## 2014-03-04 NOTE — MAU Note (Addendum)
States this AM, starting having back pain, cramping and nausea. Appears very anxious. States she did not go this far with her last pregnancy.

## 2014-03-04 NOTE — Progress Notes (Signed)
Asked patient why she feels the need to breathe so fast. Patient states "I am hurting." Denies SOB. Encouraged to slow breathing and discussed relaxation techniques.

## 2014-03-04 NOTE — MAU Provider Note (Signed)
History     CSN: [id number]  Arrival date and time: 03/04/14 1503   First Provider Initiated Contact with Patient 03/04/14 1604      Chief Complaint  Patient presents with  . Back Pain   HPI  [name] is a 33 y.o. female G2P0101 at 65w5dwho presents with contractions. Pt feels her contractions started this morning and have progressively gotten worse. Around 1300 she felt they were regular and very painful. The patient has a history of a 33 week delivery due to preeclampsia; ? HELP syndrome. She plans to have a repeat c section with this pregnancy.  She reports good fetal movement, denies LOF, vaginal bleeding, vaginal itching/burning, urinary symptoms, h/a, dizziness, n/v, or fever/chills.    OB History   Grav Para Term Preterm Abortions TAB SAB Ect Mult Living   2 1 0 1 0 0 0 0 0 1       Past Medical History  Diagnosis Date  . Pre-eclampsia     Past Surgical History  Procedure Laterality Date  . Cesarean section      Family History  Problem Relation Age of Onset  . Cancer Father   . Cancer Maternal Grandfather   . Cancer Paternal Grandfather     History  Substance Use Topics  . Smoking status: Never Smoker   . Smokeless tobacco: Never Used  . Alcohol Use: No    Allergies: No Known Allergies  Prescriptions prior to admission  Medication Sig Dispense Refill  . aspirin EC 81 MG tablet Take 1 tablet (81 mg total) by mouth daily.  30 tablet  8  . omeprazole (PRILOSEC) 20 MG capsule Take 1 capsule (20 mg total) by mouth 2 (two) times daily before a meal.  60 capsule  5  . cyclobenzaprine (FLEXERIL) 10 MG tablet Take 1 tablet (10 mg total) by mouth 3 (three) times daily as needed for muscle spasms.  15 tablet  0   Results for orders placed during the hospital encounter of 03/04/14 (from the past 48 hour(s))  URINALYSIS, ROUTINE W REFLEX MICROSCOPIC     Status: Abnormal   Collection Time    03/04/14  3:10 PM      Result Value Ref Range   Color, Urine  YELLOW  YELLOW   APPearance HAZY (*) CLEAR   Specific Gravity, Urine 1.025  1.005 - 1.030   pH 6.0  5.0 - 8.0   Glucose, UA NEGATIVE  NEGATIVE mg/dL   Hgb urine dipstick NEGATIVE  NEGATIVE   Bilirubin Urine NEGATIVE  NEGATIVE   Ketones, ur 15 (*) NEGATIVE mg/dL   Protein, ur 30 (*) NEGATIVE mg/dL   Urobilinogen, UA 0.2  0.0 - 1.0 mg/dL   Nitrite NEGATIVE  NEGATIVE   Leukocytes, UA SMALL (*) NEGATIVE  URINE MICROSCOPIC-ADD ON     Status: Abnormal   Collection Time    03/04/14  3:10 PM      Result Value Ref Range   Squamous Epithelial / LPF MANY (*) RARE   WBC, UA 3-6  <3 WBC/hpf   Urine-Other MUCOUS PRESENT    CBC     Status: Abnormal   Collection Time    03/04/14  4:22 PM      Result Value Ref Range   WBC 13.8 (*) 4.0 - 10.5 K/uL   RBC 3.68 (*) 3.87 - 5.11 MIL/uL   Hemoglobin 11.8 (*) 12.0 - 15.0 g/dL   HCT 34.1 (*) 36.0 - 46.0 %   MCV 92.7  78.0 - 100.0 fL   MCH 32.1  26.0 - 34.0 pg   MCHC 34.6  30.0 - 36.0 g/dL   RDW 13.1  11.5 - 15.5 %   Platelets 200  150 - 400 K/uL  COMPREHENSIVE METABOLIC PANEL     Status: Abnormal   Collection Time    03/04/14  4:22 PM      Result Value Ref Range   Sodium 138  137 - 147 mEq/L   Potassium 4.3  3.7 - 5.3 mEq/L   Chloride 101  96 - 112 mEq/L   CO2 21  19 - 32 mEq/L   Glucose, Bld 80  70 - 99 mg/dL   BUN 9  6 - 23 mg/dL   Creatinine, Ser 0.68  0.50 - 1.10 mg/dL   Calcium 9.5  8.4 - 10.5 mg/dL   Total Protein 6.4  6.0 - 8.3 g/dL   Albumin 2.7 (*) 3.5 - 5.2 g/dL   AST 20  0 - 37 U/L   ALT 13  0 - 35 U/L   Alkaline Phosphatase 202 (*) 39 - 117 U/L   Total Bilirubin 0.2 (*) 0.3 - 1.2 mg/dL   GFR calc non Af Amer >90  >90 mL/min   GFR calc Af Amer >90  >90 mL/min   Comment: (NOTE)     The eGFR has been calculated using the CKD EPI equation.     This calculation has not been validated in all clinical situations.     eGFR's persistently <90 mL/min signify possible Chronic Kidney     Disease.  URIC ACID     Status: None    Collection Time    03/04/14  4:22 PM      Result Value Ref Range   Uric Acid, Serum 5.8  2.4 - 7.0 mg/dL  LACTATE DEHYDROGENASE     Status: None   Collection Time    03/04/14  4:22 PM      Result Value Ref Range   LDH 188  94 - 250 U/L   Review of Systems  Constitutional: Negative for fever and chills.  Eyes: Negative for blurred vision.  Respiratory: Negative for shortness of breath.   Cardiovascular: Negative for chest pain.  Gastrointestinal: Negative for abdominal pain.  Genitourinary: Negative for dysuria, urgency and frequency.       No vaginal discharge. No vaginal bleeding. No dysuria.   Musculoskeletal: Positive for back pain (+ pt is feeling contractions in her back).  Neurological: Negative for headaches.   Physical Exam   Blood pressure 132/94, pulse 101, temperature 98 F (36.7 C), temperature source Oral, resp. rate 22, last menstrual period 06/30/2013, SpO2 97.00%.  Physical Exam  Constitutional: She is oriented to person, place, and time. She appears well-developed and well-nourished. No distress.  HENT:  Head: Normocephalic.  Eyes: Pupils are equal, round, and reactive to light.  Neck: Neck supple.  Respiratory: Effort normal.  GI: Soft. She exhibits no distension. There is no tenderness. There is no rebound and no guarding.  Musculoskeletal: Normal range of motion.  Neurological: She is alert and oriented to person, place, and time.  Skin: Skin is warm. She is not diaphoretic.  Psychiatric: Her behavior is normal. Her mood appears anxious.    Dilation: Closed Effacement (%): 80 Cervical Position: Middle Station: -2 Presentation: Vertex Exam by:: Lavonna Rua, RNC  Cervical recheck Dilation: Closed Effacement (%): 80 Cervical Position: Middle Station: -3 Presentation: Vertex Exam by:: Lavonna Rua, RNC   Fetal Tracing:  Baseline: 145 Variability: Moderate Accelerations: 15x15 Decelerations: None Toco: contractions 2-5 mins apart; irregular  pattern at times   MAU Course  Procedures None  MDM UA shows 30 of protein  PIH labs Nubain 10 mg IM Zofran 4 mg PO  D5LR bolus  Dr. Ruthann Cancer contacted; he is in a delivery and will call me back when finished 1755.  Pt feels much better following treatment; pt currently has minimal pain  Discussed fetal tracing and contraction pattern with Dr. Ruthann Cancer; terbutaline 0.25 ordered  1900: patient rates her contraction pain 6/10 (on admission patient rated her pain 9/10) Pt requests additional Zofran.  Admit to [facility] for observation and magnesium sulfate    Assessment and Plan   A: Preterm labor  Planned repeat Cesarea section  P; Admit to [facility] per Dr. Ruthann Cancer Magnesium sulfate  NPO    Darrelyn Hillock Sheri Prows, NP  03/04/2014, 4:08 PM

## 2014-03-05 ENCOUNTER — Encounter (HOSPITAL_COMMUNITY): Payer: Self-pay | Admitting: Obstetrics

## 2014-03-05 DIAGNOSIS — O4700 False labor before 37 completed weeks of gestation, unspecified trimester: Secondary | ICD-10-CM | POA: Diagnosis present

## 2014-03-05 LAB — PROTEIN / CREATININE RATIO, URINE
Creatinine, Urine: 191.49 mg/dL
Protein Creatinine Ratio: 0.28 — ABNORMAL HIGH (ref 0.00–0.15)
Total Protein, Urine: 54.4 mg/dL

## 2014-03-05 MED ORDER — BUTALBITAL-APAP-CAFFEINE 50-325-40 MG PO TABS: 2.0000 | ORAL_TABLET | Freq: Four times a day (QID) | ORAL | Status: DC | PRN

## 2014-03-05 MED ORDER — HYDROMORPHONE HCL 2 MG PO TABS
4.0000 mg | ORAL_TABLET | Freq: Once | ORAL | Status: AC
  Administered 2014-03-05: 4 mg via ORAL
  Filled 2014-03-05: qty 2

## 2014-03-05 MED ORDER — BUTALBITAL-APAP-CAFFEINE 50-325-40 MG PO TABS
2.0000 | ORAL_TABLET | Freq: Once | ORAL | Status: AC
  Administered 2014-03-05: 2 via ORAL
  Filled 2014-03-05: qty 2

## 2014-03-05 NOTE — Progress Notes (Signed)
MD made aware that pt complaining of h/a after sleeping for several hours.  Protein/creatinine ratio results to MD and orders received for Fiorcet for h/a

## 2014-03-05 NOTE — Progress Notes (Signed)
Monitor off after reassurring FHR   

## 2014-03-05 NOTE — Progress Notes (Signed)
Pt cont to c/o headache

## 2014-03-05 NOTE — Discharge Summary (Signed)
Physician Discharge Summary  Patient ID: Tina [name] MRN: [id number] DOB/AGE: 12-26-1980 32 y.o.  Admit date: 03/04/2014 Discharge date: [name]  Admission Diagnoses:  35 weeks.  Threatened PTL  Discharge Diagnoses: Same. Active Problems:   Preterm labor   Threatened preterm labor, antepartum   Discharged Condition: good  Hospital Course: Admitted with UC's.  Started on magnesium sulfate and contractions resolved.  Discharged home in good condition.  Consults: None  Significant Diagnostic Studies: labs: Urine Pr / Cr ratio.  Treatments: IV hydration and analgesia: Dilaudid  Discharge Exam: Blood pressure 125/77, pulse 95, temperature 98 F (36.7 C), temperature source Oral, resp. rate 20, last menstrual period 06/30/2013, SpO2 98.00%. General appearance: alert and no distress Resp: clear to auscultation bilaterally Cardio: regular rate and rhythm, S1, S2 normal, no murmur, click, rub or gallop GI: normal findings: soft, non-tender Pelvic: Cervix closed. Extremities: extremities normal, atraumatic, no cyanosis or edema  Disposition: 01-Home or Self Care  Discharge Orders   Future Appointments Provider Department Dept Phone   03/07/2014 11:15 AM Tina Badharles A Ashtin Rosner, MD [facility] [phone]   Future Orders Complete By Expires   Discharge activity: Bedrest  As directed    Discharge diet:  As directed    Do not have sex or do anything that might make you have an orgasm  As directed    Notify physician for a general feeling that "something is not right"  As directed    Notify physician for increase or change in vaginal discharge  As directed    Notify physician for intestinal cramps, with or without diarrhea, sometimes described as "gas pain"  As directed    Notify physician for leaking of fluid  As directed    Notify physician for low, dull backache, unrelieved by heat or Tylenol  As directed    Notify physician for menstrual like cramps  As directed    Notify physician for pelvic pressure  As directed    Notify physician for uterine contractions.  These may be painless and feel like the uterus is tightening or the baby is  "balling up"  As directed    Notify physician for vaginal bleeding  As directed    PRETERM LABOR:  Includes any of the follwing symptoms that occur between 20 - [redacted] weeks gestation.  If these symptoms are not stopped, preterm labor can result in preterm delivery, placing your baby at risk  As directed        Medication List         aspirin EC 81 MG tablet  Take 1 tablet (81 mg total) by mouth daily.     butalbital-acetaminophen-caffeine 50-325-40 MG per tablet  Commonly known as:  FIORICET  Take 2 tablets by mouth every 6 (six) hours as needed for headache.     cyclobenzaprine 10 MG tablet  Commonly known as:  FLEXERIL  Take 1 tablet (10 mg total) by mouth 3 (three) times daily as needed for muscle spasms.     omeprazole 20 MG capsule  Commonly known as:  PRILOSEC  Take 1 capsule (20 mg total) by mouth 2 (two) times daily before a meal.           Follow-up Information   Follow up with Jalen Oberry A, MD. Schedule an appointment as soon as possible for a visit in 2 weeks.   Specialty:  Obstetrics and Gynecology   Contact information:   [address] [place] [id number] [phone]  Signed: Brock BadCharles A Nohelani Benning [name], 6:27 PM

## 2014-03-05 NOTE — H&P (Signed)
Tina [name] is a 33 y.o. female presenting for UC's. History OB History   Grav Para Term Preterm Abortions TAB SAB Ect Mult Living   2 1 0 1 0 0 0 0 0 1      Past Medical History  Diagnosis Date  . Pre-eclampsia    Past Surgical History  Procedure Laterality Date  . Cesarean section     Family History: family history includes Cancer in her father, maternal grandfather, and paternal grandfather. Social History:  reports that she has never smoked. She has never used smokeless tobacco. She reports that she does not drink alcohol or use illicit drugs.   Prenatal Transfer Tool  Maternal Diabetes: No Genetic Screening: Normal Maternal Ultrasounds/Referrals:  MFM referral for H/O preeclampsia with preterm delivery by C/S. Fetal Ultrasounds or other Referrals:  None Maternal Substance Abuse:  No Significant Maternal Medications:  None Significant Maternal Lab Results:  None Other Comments:  None  ROS  Dilation: Closed Effacement (%): 80 Station: -3 Exam by:: S. Carrera, RNC Blood pressure 126/62, pulse 109, temperature 98.2 F (36.8 C), temperature source Oral, resp. rate 22, last menstrual period 06/30/2013, SpO2 95.00%. Exam Physical Exam  Prenatal labs: ABO, Rh: O/POS/-- (10/28 1134) Antibody: NEG (10/28 1134) Rubella: 2.91 (10/28 1134) RPR: NON REAC (03/06 1119)  HBsAg: NEGATIVE (10/28 1134)  HIV: NON REACTIVE (03/06 1119)  GBS:     Assessment/Plan: 35 weeks.  Preterm UC's.  Previous C/S.  Not in labor.  Magnesium sulfate discontinued.  Continue observation off Magnesium sulfate.   Brock Badharles A Harper 03/05/2014, 9:10 AM

## 2014-03-05 NOTE — Progress Notes (Signed)
UR completed 

## 2014-03-05 NOTE — Progress Notes (Signed)
Pt states that her head stopped hurting and she slept but that she awoke and it continues to hurt.  Pt instructed to order some lunch, and maybe some caffeine if she typically has that in the morning.

## 2014-03-05 NOTE — Progress Notes (Signed)
MD made aware that pt called out with complaints that she "cant breathe" .  Pt further describing it that it hurts in her left upper abd area when she takes a deep breath.  O2 sats 96-98 on RA and lungs clear.  MD orders to d/c Mag

## 2014-03-07 ENCOUNTER — Ambulatory Visit (INDEPENDENT_AMBULATORY_CARE_PROVIDER_SITE_OTHER): Payer: Managed Care, Other (non HMO) | Admitting: Obstetrics & Gynecology

## 2014-03-07 ENCOUNTER — Encounter: Payer: Self-pay | Admitting: Obstetrics & Gynecology

## 2014-03-07 ENCOUNTER — Encounter: Payer: Self-pay | Admitting: *Deleted

## 2014-03-07 VITALS — BP 132/82 | HR 85 | Wt 230.0 lb

## 2014-03-07 DIAGNOSIS — Z3480 Encounter for supervision of other normal pregnancy, unspecified trimester: Secondary | ICD-10-CM

## 2014-03-07 DIAGNOSIS — R112 Nausea with vomiting, unspecified: Secondary | ICD-10-CM

## 2014-03-07 LAB — CBC
HEMATOCRIT: 33.1 % — AB (ref 36.0–46.0)
HEMOGLOBIN: 11.3 g/dL — AB (ref 12.0–15.0)
MCH: 31 pg (ref 26.0–34.0)
MCHC: 34.1 g/dL (ref 30.0–36.0)
MCV: 90.9 fL (ref 78.0–100.0)
Platelets: 216 10*3/uL (ref 150–400)
RBC: 3.64 MIL/uL — AB (ref 3.87–5.11)
RDW: 13.5 % (ref 11.5–15.5)
WBC: 8.5 10*3/uL (ref 4.0–10.5)

## 2014-03-07 LAB — LACTATE DEHYDROGENASE: LDH: 172 U/L (ref 94–250)

## 2014-03-07 LAB — CREATININE, SERUM: CREATININE: 0.73 mg/dL (ref 0.50–1.10)

## 2014-03-07 LAB — ALT: ALT: 26 U/L (ref 0–35)

## 2014-03-07 LAB — AST: AST: 35 U/L (ref 0–37)

## 2014-03-07 MED ORDER — METOCLOPRAMIDE HCL 10 MG PO TABS: 10.0000 mg | ORAL_TABLET | Freq: Four times a day (QID) | ORAL | Status: DC

## 2014-03-08 LAB — PATHOLOGIST SMEAR REVIEW

## 2014-03-08 LAB — PROTEIN / CREATININE RATIO, URINE
Creatinine, Urine: 486.8 mg/dL
Protein Creatinine Ratio: 0.1 (ref <0.15)
TOTAL PROTEIN, URINE: 51 mg/dL

## 2014-03-08 LAB — STREP B DNA PROBE: GBSP: NOT DETECTED

## 2014-03-10 NOTE — Patient Instructions (Signed)
Viral Gastroenteritis Viral gastroenteritis is also known as stomach flu. This condition affects the stomach and intestinal tract. It can cause sudden diarrhea and vomiting. The illness typically lasts 3 to 8 days. Most people develop an immune response that eventually gets rid of the virus. While this natural response develops, the virus can make you quite ill. CAUSES  Many different viruses can cause gastroenteritis, such as rotavirus or noroviruses. You can catch one of these viruses by consuming contaminated food or water. You may also catch a virus by sharing utensils or other personal items with an infected person or by touching a contaminated surface. SYMPTOMS  The most common symptoms are diarrhea and vomiting. These problems can cause a severe loss of body fluids (dehydration) and a body salt (electrolyte) imbalance. Other symptoms may include:  Fever.  Headache.  Fatigue.  Abdominal pain. DIAGNOSIS  Your caregiver can usually diagnose viral gastroenteritis based on your symptoms and a physical exam. A stool sample may also be taken to test for the presence of viruses or other infections. TREATMENT  This illness typically goes away on its own. Treatments are aimed at rehydration. The most serious cases of viral gastroenteritis involve vomiting so severely that you are not able to keep fluids down. In these cases, fluids must be given through an intravenous line (IV). HOME CARE INSTRUCTIONS   Drink enough fluids to keep your urine clear or pale yellow. Drink small amounts of fluids frequently and increase the amounts as tolerated.  Ask your caregiver for specific rehydration instructions.  Avoid:  Foods high in sugar.  Alcohol.  Carbonated drinks.  Tobacco.  Juice.  Caffeine drinks.  Extremely hot or cold fluids.  Fatty, greasy foods.  Too much intake of anything at one time.  Dairy products until 24 to 48 hours after diarrhea stops.  You may consume probiotics.  Probiotics are active cultures of beneficial bacteria. They may lessen the amount and number of diarrheal stools in adults. Probiotics can be found in yogurt with active cultures and in supplements.  Wash your hands well to avoid spreading the virus.  Only take over-the-counter or prescription medicines for pain, discomfort, or fever as directed by your caregiver. Do not give aspirin to children. Antidiarrheal medicines are not recommended.  Ask your caregiver if you should continue to take your regular prescribed and over-the-counter medicines.  Keep all follow-up appointments as directed by your caregiver. SEEK IMMEDIATE MEDICAL CARE IF:   You are unable to keep fluids down.  You do not urinate at least once every 6 to 8 hours.  You develop shortness of breath.  You notice blood in your stool or vomit. This may look like coffee grounds.  You have abdominal pain that increases or is concentrated in one small area (localized).  You have persistent vomiting or diarrhea.  You have a fever.  The patient is a child younger than 3 months, and he or she has a fever.  The patient is a child older than 3 months, and he or she has a fever and persistent symptoms.  The patient is a child older than 3 months, and he or she has a fever and symptoms suddenly get worse.  The patient is a baby, and he or she has no tears when crying. MAKE SURE YOU:   Understand these instructions.  Will watch your condition.  Will get help right away if you are not doing well or get worse. Document Released: 10/11/2005 Document Revised: 01/03/2012 Document Reviewed: 07/28/2011   [employer] Patient Information 2014 [employer]  

## 2014-03-10 NOTE — Progress Notes (Signed)
Subjective:    Tina [name] is a 33 y.o. female being seen today for her obstetrical visit. She is at 7362w1d gestation. Patient reports no new complaints. Fetal movement: normal.  Problem List Items Addressed This Visit   None    Visit Diagnoses   Nausea and vomiting    -  Primary    Relevant Medications       metoclopramide (REGLAN) tablet    Supervision of other normal pregnancy        Relevant Orders       Lactate dehydrogenase (Completed)       Creatinine, serum (Completed)       CBC (Completed)       ALT (Completed)       AST (Completed)       Pathologist smear review (Completed)       Protein / creatinine ratio, urine (Completed)       Strep B DNA probe (Completed)      Patient Active Problem List   Diagnosis Date Noted  . Threatened preterm labor, antepartum 03/05/2014  . Preterm labor 03/04/2014  . Carpal tunnel syndrome on both sides 02/19/2014  . GERD without esophagitis 02/19/2014  . Unspecified high-risk pregnancy 12/19/2013  . Sinusitis, bacterial 12/19/2013  . Heartburn in pregnancy 10/02/2013  . Hx of HELLP syndrome, currently pregnant 08/21/2013  . H/O: C-section 08/21/2013  . Nausea and vomiting in pregnancy 08/21/2013  . High risk pregnancy due to history of previous obstetrical problem in first trimester 08/21/2013   Objective:    BP 132/82  Pulse 85  Wt 104.327 kg (230 lb)  LMP 06/30/2013 FHT:  130 BPM  Uterine Size: size equals dates  Presentation: Vtx     Assessment:    Pregnancy @ 7962w1d weeks  ?Viral syndrome, migraine HA  Plan:     labs reviewed, problem list updated GBS sent PIH signs/symptoms reviewed Orders Placed This Encounter  Procedures  . Strep B DNA probe  . Lactate dehydrogenase  . Creatinine, serum  . CBC  . ALT  . AST  . Pathologist smear review  . Protein / creatinine ratio, urine   Meds ordered this encounter  Medications  . metoCLOPramide (REGLAN) 10 MG tablet    Sig: Take 1 tablet (10 mg total) by mouth 4  (four) times daily.    Dispense:  12 tablet    Refill:  1   Follow up in 1 Week.

## 2014-03-11 ENCOUNTER — Encounter: Payer: Self-pay | Admitting: Obstetrics

## 2014-03-11 ENCOUNTER — Ambulatory Visit (INDEPENDENT_AMBULATORY_CARE_PROVIDER_SITE_OTHER): Payer: Managed Care, Other (non HMO) | Admitting: Obstetrics

## 2014-03-11 VITALS — BP 121/84 | HR 91 | Temp 98.3°F | Wt 241.0 lb

## 2014-03-11 DIAGNOSIS — Z3480 Encounter for supervision of other normal pregnancy, unspecified trimester: Secondary | ICD-10-CM

## 2014-03-11 LAB — POCT URINALYSIS DIPSTICK
Blood, UA: NEGATIVE
GLUCOSE UA: NEGATIVE
KETONES UA: NEGATIVE
Nitrite, UA: NEGATIVE
SPEC GRAV UA: 1.015
pH, UA: 7

## 2014-03-11 NOTE — Progress Notes (Signed)
Subjective:    Tina [name] is a 33 y.o. female being seen today for her obstetrical visit. She is at 5049w5d gestation. Patient reports no complaints. Fetal movement: normal.  Problem List Items Addressed This Visit   None    Visit Diagnoses   Supervision of other normal pregnancy    -  Primary    Relevant Orders       POCT urinalysis dipstick (Completed)      Patient Active Problem List   Diagnosis Date Noted  . Threatened preterm labor, antepartum 03/05/2014  . Preterm labor 03/04/2014  . Carpal tunnel syndrome on both sides 02/19/2014  . GERD without esophagitis 02/19/2014  . Unspecified high-risk pregnancy 12/19/2013  . Sinusitis, bacterial 12/19/2013  . Heartburn in pregnancy 10/02/2013  . Hx of HELLP syndrome, currently pregnant 08/21/2013  . H/O: C-section 08/21/2013  . Nausea and vomiting in pregnancy 08/21/2013  . High risk pregnancy due to history of previous obstetrical problem in first trimester 08/21/2013   Objective:    BP 121/84  Pulse 91  Temp(Src) 98.3 F (36.8 C)  Wt 241 lb (109.317 kg)  LMP 06/30/2013 FHT:  140 BPM  Uterine Size: size equals dates  Presentation: unsure     Assessment:    Pregnancy @ 6149w5d weeks   Plan:     labs reviewed, problem list updated Consent signed. GBS sent TDAP offered  Rhogam given for RH negative Pediatrician: discussed. Infant feeding: plans to breastfeed. Maternity leave: discussed. Cigarette smoking: never smoked. Orders Placed This Encounter  Procedures  . POCT urinalysis dipstick   No orders of the defined types were placed in this encounter.   Follow up in 1 Week.

## 2014-03-12 ENCOUNTER — Other Ambulatory Visit: Payer: Self-pay | Admitting: *Deleted

## 2014-03-19 ENCOUNTER — Telehealth: Payer: Self-pay | Admitting: *Deleted

## 2014-03-19 ENCOUNTER — Encounter (HOSPITAL_COMMUNITY): Payer: Self-pay

## 2014-03-19 NOTE — Telephone Encounter (Signed)
Fax received from pharmacy for refill on pt Omeprazole DR 20mg , 90 day supply.  Please advise.

## 2014-03-20 ENCOUNTER — Ambulatory Visit (INDEPENDENT_AMBULATORY_CARE_PROVIDER_SITE_OTHER): Payer: Managed Care, Other (non HMO) | Admitting: Obstetrics

## 2014-03-20 ENCOUNTER — Encounter: Payer: Self-pay | Admitting: Obstetrics

## 2014-03-20 VITALS — BP 135/94 | HR 90 | Temp 98.0°F | Wt 232.0 lb

## 2014-03-20 DIAGNOSIS — Z3480 Encounter for supervision of other normal pregnancy, unspecified trimester: Secondary | ICD-10-CM

## 2014-03-20 NOTE — Progress Notes (Signed)
Subjective:    Tina [name] is a 33 y.o. female being seen today for her obstetrical visit. She is at [redacted]w[redacted]d gestation. Patient reports no complaints. Fetal movement: normal.  Problem List Items Addressed This Visit   None    Visit Diagnoses   Supervision of other normal pregnancy    -  Primary    Relevant Orders       POCT urinalysis dipstick      Patient Active Problem List   Diagnosis Date Noted  . Threatened preterm labor, antepartum 03/05/2014  . Preterm labor 03/04/2014  . Carpal tunnel syndrome on both sides 02/19/2014  . GERD without esophagitis 02/19/2014  . Unspecified high-risk pregnancy 12/19/2013  . Sinusitis, bacterial 12/19/2013  . Heartburn in pregnancy 10/02/2013  . Hx of HELLP syndrome, currently pregnant 08/21/2013  . H/O: C-section 08/21/2013  . Nausea and vomiting in pregnancy 08/21/2013  . High risk pregnancy due to history of previous obstetrical problem in first trimester 08/21/2013    Objective:    BP 135/94  Pulse 90  Temp(Src) 98 F (36.7 C)  Wt 232 lb (105.235 kg)  LMP 06/30/2013 FHT: 140 BPM  Uterine Size: size equals dates  Presentations: cephalic  Pelvic Exam: Deferred.    Assessment:    Pregnancy @ [redacted]w[redacted]d weeks   Plan:   Plans for delivery: C/Section scheduled; labs reviewed; problem list updated Counseling: Consent signed. Infant feeding: plans to breastfeed. Cigarette smoking: never smoked. L&D discussion: symptoms of labor, discussed when to call, discussed what number to call, anesthetic/analgesic options reviewed and delivering clinician:  plans Physician. Postpartum supports and preparation: circumcision discussed and contraception plans discussed.  Repeat C/S scheduled at 39 weeks.

## 2014-03-26 ENCOUNTER — Encounter (HOSPITAL_COMMUNITY): Payer: Self-pay

## 2014-03-26 NOTE — Patient Instructions (Addendum)
   Your procedure is scheduled on:  Friday, June 5  Enter through the [employer] [facility] at: 11:15 PM Pick up the phone at the desk and dial [phone] and inform us of your arrival.  Please call this number if you have any problems the morning of surgery: [phone]  Remember: Do not eat food after midnight: Thursday Do not drink clear liquids after: 8:30 AM Friday, day of surgery Take these medicines the morning of surgery with a SIP OF WATER:  Prilosec  Do not wear jewelry, make-up, or FINGER nail polish No metal in your hair or on your body. Do not wear lotions, powders, perfumes.  You may wear deodorant.  Do not bring valuables to the hospital. Contacts, dentures or bridgework may not be worn into surgery.  Leave suitcase in the car. After Surgery it may be brought to your room. For patients being admitted to the hospital, checkout time is 11:00am the day of discharge.  Home with husband Renae Gloss.

## 2014-03-28 ENCOUNTER — Encounter (HOSPITAL_COMMUNITY): Payer: Self-pay

## 2014-03-28 ENCOUNTER — Encounter (HOSPITAL_COMMUNITY)
Admission: RE | Admit: 2014-03-28 | Discharge: 2014-03-28 | Disposition: A | Discharge status: Home / Self Care | Payer: Managed Care, Other (non HMO) | Source: Ambulatory Visit | Attending: Obstetrics | Admitting: Obstetrics

## 2014-03-28 VITALS — BP 132/96 | HR 85 | Resp 18 | Ht 63.0 in | Wt 235.0 lb

## 2014-03-28 DIAGNOSIS — Z98891 History of uterine scar from previous surgery: Secondary | ICD-10-CM

## 2014-03-28 DIAGNOSIS — O09299 Supervision of pregnancy with other poor reproductive or obstetric history, unspecified trimester: Secondary | ICD-10-CM

## 2014-03-28 DIAGNOSIS — O09291 Supervision of pregnancy with other poor reproductive or obstetric history, first trimester: Secondary | ICD-10-CM

## 2014-03-28 DIAGNOSIS — O219 Vomiting of pregnancy, unspecified: Secondary | ICD-10-CM

## 2014-03-28 HISTORY — DX: Gastro-esophageal reflux disease without esophagitis: K21.9

## 2014-03-28 LAB — TYPE AND SCREEN
ABO/RH(D): O POS
Antibody Screen: NEGATIVE

## 2014-03-28 LAB — ABO/RH: ABO/RH(D): O POS

## 2014-03-28 LAB — CBC
HEMATOCRIT: 30.8 % — AB (ref 36.0–46.0)
Hemoglobin: 10.8 g/dL — ABNORMAL LOW (ref 12.0–15.0)
MCH: 31.9 pg (ref 26.0–34.0)
MCHC: 35.1 g/dL (ref 30.0–36.0)
MCV: 90.9 fL (ref 78.0–100.0)
Platelets: 185 10*3/uL (ref 150–400)
RBC: 3.39 MIL/uL — AB (ref 3.87–5.11)
RDW: 13.5 % (ref 11.5–15.5)
WBC: 12.7 10*3/uL — AB (ref 4.0–10.5)

## 2014-03-28 LAB — RPR

## 2014-03-29 ENCOUNTER — Encounter (HOSPITAL_COMMUNITY)
Admission: RE | Disposition: A | Discharge status: Home / Self Care | Payer: Self-pay | Source: Ambulatory Visit | Attending: Obstetrics

## 2014-03-29 ENCOUNTER — Inpatient Hospital Stay (HOSPITAL_COMMUNITY)
Admission: RE | Admit: 2014-03-29 | Discharge: 2014-04-01 | DRG: 765 | Disposition: A | Discharge status: Home / Self Care | Payer: Managed Care, Other (non HMO) | Source: Ambulatory Visit | Attending: Obstetrics | Admitting: Obstetrics

## 2014-03-29 ENCOUNTER — Inpatient Hospital Stay (HOSPITAL_COMMUNITY): Payer: Managed Care, Other (non HMO) | Admitting: Anesthesiology

## 2014-03-29 ENCOUNTER — Encounter (HOSPITAL_COMMUNITY): Payer: Managed Care, Other (non HMO) | Admitting: Anesthesiology

## 2014-03-29 ENCOUNTER — Encounter (HOSPITAL_COMMUNITY): Payer: Self-pay | Admitting: Anesthesiology

## 2014-03-29 DIAGNOSIS — K219 Gastro-esophageal reflux disease without esophagitis: Secondary | ICD-10-CM | POA: Diagnosis present

## 2014-03-29 DIAGNOSIS — Z6841 Body Mass Index (BMI) 40.0 and over, adult: Secondary | ICD-10-CM

## 2014-03-29 DIAGNOSIS — O9902 Anemia complicating childbirth: Secondary | ICD-10-CM | POA: Diagnosis present

## 2014-03-29 DIAGNOSIS — E669 Obesity, unspecified: Secondary | ICD-10-CM | POA: Diagnosis present

## 2014-03-29 DIAGNOSIS — Z302 Encounter for sterilization: Secondary | ICD-10-CM

## 2014-03-29 DIAGNOSIS — O99214 Obesity complicating childbirth: Secondary | ICD-10-CM

## 2014-03-29 DIAGNOSIS — O1002 Pre-existing essential hypertension complicating childbirth: Secondary | ICD-10-CM | POA: Diagnosis present

## 2014-03-29 DIAGNOSIS — O34219 Maternal care for unspecified type scar from previous cesarean delivery: Principal | ICD-10-CM | POA: Diagnosis present

## 2014-03-29 DIAGNOSIS — D649 Anemia, unspecified: Secondary | ICD-10-CM | POA: Diagnosis present

## 2014-03-29 SURGERY — Surgical Case
Anesthesia: Spinal | Site: Abdomen

## 2014-03-29 MED ORDER — MORPHINE SULFATE (PF) 0.5 MG/ML IJ SOLN
INTRAMUSCULAR | Status: DC | PRN
  Administered 2014-03-29: .15 mg via INTRATHECAL

## 2014-03-29 MED ORDER — DIPHENHYDRAMINE HCL 25 MG PO CAPS: 25.0000 mg | ORAL_CAPSULE | ORAL | Status: DC | PRN

## 2014-03-29 MED ORDER — HYDROMORPHONE HCL PF 1 MG/ML IJ SOLN
1.0000 mg | Freq: Once | INTRAMUSCULAR | Status: AC
  Administered 2014-03-29: 1 mg via INTRAVENOUS
  Filled 2014-03-29: qty 1

## 2014-03-29 MED ORDER — LACTATED RINGERS IV SOLN
INTRAVENOUS | Status: DC
Start: 2014-03-29 — End: 2014-03-29
  Administered 2014-03-29 (×3): via INTRAVENOUS

## 2014-03-29 MED ORDER — LACTATED RINGERS IV SOLN
INTRAVENOUS | Status: DC
  Administered 2014-03-29: 18:00:00 via INTRAVENOUS

## 2014-03-29 MED ORDER — LANOLIN HYDROUS EX OINT: 1.0000 "application " | TOPICAL_OINTMENT | CUTANEOUS | Status: DC | PRN

## 2014-03-29 MED ORDER — CEFAZOLIN SODIUM-DEXTROSE 2-3 GM-% IV SOLR
2.0000 g | INTRAVENOUS | Status: AC
  Administered 2014-03-29: 2 g via INTRAVENOUS

## 2014-03-29 MED ORDER — MEASLES, MUMPS & RUBELLA VAC ~~LOC~~ INJ: 0.5000 mL | INJECTION | Freq: Once | SUBCUTANEOUS | Status: DC

## 2014-03-29 MED ORDER — SIMETHICONE 80 MG PO CHEW
80.0000 mg | CHEWABLE_TABLET | ORAL | Status: DC
  Administered 2014-03-29 – 2014-03-31 (×3): 80 mg via ORAL
  Filled 2014-03-29 (×4): qty 1

## 2014-03-29 MED ORDER — TETANUS-DIPHTH-ACELL PERTUSSIS 5-2.5-18.5 LF-MCG/0.5 IM SUSP: 0.5000 mL | Freq: Once | INTRAMUSCULAR | Status: DC

## 2014-03-29 MED ORDER — SCOPOLAMINE 1 MG/3DAYS TD PT72
MEDICATED_PATCH | TRANSDERMAL | Status: AC
  Administered 2014-03-29: 1.5 mg via TRANSDERMAL
  Filled 2014-03-29: qty 1

## 2014-03-29 MED ORDER — OXYTOCIN 40 UNITS IN LACTATED RINGERS INFUSION - SIMPLE MED: 62.5000 mL/h | INTRAVENOUS | Status: AC

## 2014-03-29 MED ORDER — NALBUPHINE HCL 10 MG/ML IJ SOLN
5.0000 mg | INTRAMUSCULAR | Status: DC | PRN
  Filled 2014-03-29: qty 1

## 2014-03-29 MED ORDER — FENTANYL CITRATE 0.05 MG/ML IJ SOLN
INTRAMUSCULAR | Status: DC | PRN
  Administered 2014-03-29: 25 ug via INTRATHECAL

## 2014-03-29 MED ORDER — FENTANYL CITRATE 0.05 MG/ML IJ SOLN
INTRAMUSCULAR | Status: AC
  Filled 2014-03-29: qty 2

## 2014-03-29 MED ORDER — METOCLOPRAMIDE HCL 5 MG/ML IJ SOLN
INTRAMUSCULAR | Status: AC
  Filled 2014-03-29: qty 2

## 2014-03-29 MED ORDER — SCOPOLAMINE 1 MG/3DAYS TD PT72: 1.0000 | MEDICATED_PATCH | Freq: Once | TRANSDERMAL | Status: DC

## 2014-03-29 MED ORDER — ONDANSETRON HCL 4 MG/2ML IJ SOLN: 4.0000 mg | INTRAMUSCULAR | Status: DC | PRN

## 2014-03-29 MED ORDER — PROMETHAZINE HCL 25 MG/ML IJ SOLN
12.5000 mg | Freq: Four times a day (QID) | INTRAMUSCULAR | Status: DC | PRN
  Administered 2014-03-29: 12.5 mg via INTRAVENOUS

## 2014-03-29 MED ORDER — DIPHENHYDRAMINE HCL 50 MG/ML IJ SOLN: 25.0000 mg | INTRAMUSCULAR | Status: DC | PRN

## 2014-03-29 MED ORDER — ONDANSETRON HCL 4 MG PO TABS: 4.0000 mg | ORAL_TABLET | ORAL | Status: DC | PRN

## 2014-03-29 MED ORDER — OXYTOCIN 10 UNIT/ML IJ SOLN
40.0000 [IU] | INTRAVENOUS | Status: DC | PRN
  Administered 2014-03-29: 40 [IU] via INTRAVENOUS

## 2014-03-29 MED ORDER — MEPERIDINE HCL 25 MG/ML IJ SOLN: 6.2500 mg | INTRAMUSCULAR | Status: DC | PRN

## 2014-03-29 MED ORDER — OXYCODONE-ACETAMINOPHEN 5-325 MG PO TABS: 1.0000 | ORAL_TABLET | ORAL | Status: DC | PRN

## 2014-03-29 MED ORDER — NALBUPHINE HCL 10 MG/ML IJ SOLN
5.0000 mg | INTRAMUSCULAR | Status: DC | PRN
  Administered 2014-03-29 – 2014-03-30 (×2): 10 mg via SUBCUTANEOUS
  Filled 2014-03-29: qty 1

## 2014-03-29 MED ORDER — NALOXONE HCL 1 MG/ML IJ SOLN: 1.0000 ug/kg/h | INTRAVENOUS | Status: DC | PRN

## 2014-03-29 MED ORDER — MAGNESIUM HYDROXIDE 400 MG/5ML PO SUSP: 30.0000 mL | ORAL | Status: DC | PRN

## 2014-03-29 MED ORDER — OXYTOCIN 10 UNIT/ML IJ SOLN
INTRAMUSCULAR | Status: AC
  Filled 2014-03-29: qty 4

## 2014-03-29 MED ORDER — PHENYLEPHRINE 8 MG IN D5W 100 ML (0.08MG/ML) PREMIX OPTIME
INJECTION | INTRAVENOUS | Status: AC
  Filled 2014-03-29: qty 100

## 2014-03-29 MED ORDER — NALOXONE HCL 0.4 MG/ML IJ SOLN: 0.4000 mg | INTRAMUSCULAR | Status: DC | PRN

## 2014-03-29 MED ORDER — PROMETHAZINE HCL 25 MG/ML IJ SOLN
INTRAMUSCULAR | Status: AC
  Filled 2014-03-29: qty 1

## 2014-03-29 MED ORDER — ZOLPIDEM TARTRATE 5 MG PO TABS: 5.0000 mg | ORAL_TABLET | Freq: Every evening | ORAL | Status: DC | PRN

## 2014-03-29 MED ORDER — DIPHENHYDRAMINE HCL 25 MG PO CAPS: 25.0000 mg | ORAL_CAPSULE | Freq: Four times a day (QID) | ORAL | Status: DC | PRN

## 2014-03-29 MED ORDER — WITCH HAZEL-GLYCERIN EX PADS
1.0000 | MEDICATED_PAD | CUTANEOUS | Status: DC | PRN
Start: 2014-03-29 — End: 2014-04-01

## 2014-03-29 MED ORDER — ONDANSETRON HCL 4 MG/2ML IJ SOLN
INTRAMUSCULAR | Status: AC
  Filled 2014-03-29: qty 2

## 2014-03-29 MED ORDER — SIMETHICONE 80 MG PO CHEW
80.0000 mg | CHEWABLE_TABLET | ORAL | Status: DC | PRN
  Administered 2014-03-30 – 2014-03-31 (×2): 80 mg via ORAL

## 2014-03-29 MED ORDER — ONDANSETRON HCL 4 MG/2ML IJ SOLN
INTRAMUSCULAR | Status: DC | PRN
  Administered 2014-03-29: 4 mg via INTRAVENOUS

## 2014-03-29 MED ORDER — BUPIVACAINE IN DEXTROSE 0.75-8.25 % IT SOLN
INTRATHECAL | Status: DC | PRN
Start: 2014-03-29 — End: 2014-03-29
  Administered 2014-03-29: 1.4 mg via INTRATHECAL

## 2014-03-29 MED ORDER — SENNOSIDES-DOCUSATE SODIUM 8.6-50 MG PO TABS
2.0000 | ORAL_TABLET | ORAL | Status: DC
  Administered 2014-03-29 – 2014-03-31 (×3): 2 via ORAL
  Filled 2014-03-29 (×3): qty 2

## 2014-03-29 MED ORDER — METOCLOPRAMIDE HCL 5 MG/ML IJ SOLN
10.0000 mg | Freq: Three times a day (TID) | INTRAMUSCULAR | Status: DC | PRN
  Administered 2014-03-29: 10 mg via INTRAVENOUS

## 2014-03-29 MED ORDER — ONDANSETRON HCL 4 MG/2ML IJ SOLN: 4.0000 mg | Freq: Three times a day (TID) | INTRAMUSCULAR | Status: DC | PRN

## 2014-03-29 MED ORDER — KETOROLAC TROMETHAMINE 30 MG/ML IJ SOLN
INTRAMUSCULAR | Status: AC
Start: 2014-03-29 — End: 2014-03-30
  Filled 2014-03-29: qty 1

## 2014-03-29 MED ORDER — KETOROLAC TROMETHAMINE 30 MG/ML IJ SOLN
30.0000 mg | Freq: Four times a day (QID) | INTRAMUSCULAR | Status: DC | PRN
  Administered 2014-03-29: 30 mg via INTRAVENOUS

## 2014-03-29 MED ORDER — SCOPOLAMINE 1 MG/3DAYS TD PT72
1.0000 | MEDICATED_PATCH | Freq: Once | TRANSDERMAL | Status: DC
  Administered 2014-03-29: 1.5 mg via TRANSDERMAL

## 2014-03-29 MED ORDER — SODIUM CHLORIDE 0.9 % IJ SOLN: 3.0000 mL | INTRAMUSCULAR | Status: DC | PRN

## 2014-03-29 MED ORDER — PRENATAL MULTIVITAMIN CH
1.0000 | ORAL_TABLET | Freq: Every day | ORAL | Status: DC
  Administered 2014-03-30 – 2014-03-31 (×2): 1 via ORAL
  Filled 2014-03-29 (×2): qty 1

## 2014-03-29 MED ORDER — IBUPROFEN 600 MG PO TABS
600.0000 mg | ORAL_TABLET | Freq: Four times a day (QID) | ORAL | Status: DC
  Administered 2014-03-29 – 2014-04-01 (×10): 600 mg via ORAL
  Filled 2014-03-29 (×10): qty 1

## 2014-03-29 MED ORDER — MORPHINE SULFATE 0.5 MG/ML IJ SOLN
INTRAMUSCULAR | Status: AC
  Filled 2014-03-29: qty 10

## 2014-03-29 MED ORDER — KETOROLAC TROMETHAMINE 30 MG/ML IJ SOLN: 30.0000 mg | Freq: Four times a day (QID) | INTRAMUSCULAR | Status: DC | PRN

## 2014-03-29 MED ORDER — PHENYLEPHRINE 8 MG IN D5W 100 ML (0.08MG/ML) PREMIX OPTIME
INJECTION | INTRAVENOUS | Status: DC | PRN
  Administered 2014-03-29: 50 ug/min via INTRAVENOUS

## 2014-03-29 MED ORDER — DIPHENHYDRAMINE HCL 50 MG/ML IJ SOLN: 12.5000 mg | INTRAMUSCULAR | Status: DC | PRN

## 2014-03-29 MED ORDER — DIBUCAINE 1 % RE OINT: 1.0000 "application " | TOPICAL_OINTMENT | RECTAL | Status: DC | PRN

## 2014-03-29 MED ORDER — FENTANYL CITRATE 0.05 MG/ML IJ SOLN
25.0000 ug | INTRAMUSCULAR | Status: DC | PRN
  Administered 2014-03-29: 50 ug via INTRAVENOUS

## 2014-03-29 MED ORDER — FERROUS SULFATE 325 (65 FE) MG PO TABS
325.0000 mg | ORAL_TABLET | Freq: Two times a day (BID) | ORAL | Status: DC
  Administered 2014-03-30 – 2014-04-01 (×2): 325 mg via ORAL
  Filled 2014-03-29 (×5): qty 1

## 2014-03-29 SURGICAL SUPPLY — 43 items
ADH SKN CLS APL DERMABOND .7 (GAUZE/BANDAGES/DRESSINGS) ×2
CANISTER WOUND CARE 500ML ATS (WOUND CARE) IMPLANT
CLAMP CORD UMBIL (MISCELLANEOUS) IMPLANT
CLOTH BEACON ORANGE TIMEOUT ST (SAFETY) ×4 IMPLANT
CONTAINER PREFILL 10% NBF 15ML (MISCELLANEOUS) ×8 IMPLANT
DERMABOND ADVANCED (GAUZE/BANDAGES/DRESSINGS) ×2
DERMABOND ADVANCED .7 DNX12 (GAUZE/BANDAGES/DRESSINGS) ×2 IMPLANT
DRAPE LG THREE QUARTER DISP (DRAPES) IMPLANT
DRSG OPSITE POSTOP 4X10 (GAUZE/BANDAGES/DRESSINGS) ×4 IMPLANT
DRSG VAC ATS LRG SENSATRAC (GAUZE/BANDAGES/DRESSINGS) IMPLANT
DRSG VAC ATS MED SENSATRAC (GAUZE/BANDAGES/DRESSINGS) IMPLANT
DRSG VAC ATS SM SENSATRAC (GAUZE/BANDAGES/DRESSINGS) IMPLANT
DURAPREP 26ML APPLICATOR (WOUND CARE) ×4 IMPLANT
ELECT REM PT RETURN 9FT ADLT (ELECTROSURGICAL) ×4
ELECTRODE REM PT RTRN 9FT ADLT (ELECTROSURGICAL) ×2 IMPLANT
EXTRACTOR VACUUM M CUP 4 TUBE (SUCTIONS) IMPLANT
EXTRACTOR VACUUM M CUP 4' TUBE (SUCTIONS)
GLOVE BIO SURGEON STRL SZ8 (GLOVE) ×8 IMPLANT
GOWN STRL REUS W/TWL LRG LVL3 (GOWN DISPOSABLE) ×8 IMPLANT
KIT ABG SYR 3ML LUER SLIP (SYRINGE) IMPLANT
NDL HYPO 25X5/8 SAFETYGLIDE (NEEDLE) ×1 IMPLANT
NEEDLE HYPO 25X5/8 SAFETYGLIDE (NEEDLE) ×4 IMPLANT
NS IRRIG 1000ML POUR BTL (IV SOLUTION) ×4 IMPLANT
PACK C SECTION WH (CUSTOM PROCEDURE TRAY) ×4 IMPLANT
PAD OB MATERNITY 4.3X12.25 (PERSONAL CARE ITEMS) ×4 IMPLANT
RTRCTR C-SECT PINK 25CM LRG (MISCELLANEOUS) ×4 IMPLANT
STAPLER VISISTAT 35W (STAPLE) IMPLANT
SUT GUT PLAIN 0 CT-3 TAN 27 (SUTURE) IMPLANT
SUT MNCRL 0 VIOLET CTX 36 (SUTURE) ×6 IMPLANT
SUT MNCRL AB 4-0 PS2 18 (SUTURE) IMPLANT
SUT MON AB 2-0 CT1 27 (SUTURE) ×4 IMPLANT
SUT MON AB 3-0 SH 27 (SUTURE)
SUT MON AB 3-0 SH27 (SUTURE) IMPLANT
SUT MONOCRYL 0 CTX 36 (SUTURE) ×6
SUT PDS AB 0 CTX 60 (SUTURE) IMPLANT
SUT PLAIN 2 0 XLH (SUTURE) IMPLANT
SUT VIC AB 0 CTX 36 (SUTURE)
SUT VIC AB 0 CTX36XBRD ANBCTRL (SUTURE) IMPLANT
SUT VIC AB 2-0 CT1 27 (SUTURE)
SUT VIC AB 2-0 CT1 TAPERPNT 27 (SUTURE) IMPLANT
TOWEL OR 17X24 6PK STRL BLUE (TOWEL DISPOSABLE) ×4 IMPLANT
TRAY FOLEY CATH 14FR (SET/KITS/TRAYS/PACK) ×4 IMPLANT
WATER STERILE IRR 1000ML POUR (IV SOLUTION) ×4 IMPLANT

## 2014-03-29 NOTE — H&P (Signed)
Faryn Chrzanowski is a 33 y.o. female presenting for repeat C/S. Maternal Medical History:  Fetal activity: Perceived fetal activity is normal.   Last perceived fetal movement was within the past hour.    Prenatal complications: no prenatal complications Prenatal Complications - Diabetes: none.    OB History   Grav Para Term Preterm Abortions TAB SAB Ect Mult Living   2 1 0 1 0 0 0 0 0 1      Past Medical History  Diagnosis Date  . Pre-eclampsia 08/2004    History only - resolved  . GERD (gastroesophageal reflux disease)    Past Surgical History  Procedure Laterality Date  . Cesarean section  08/2004    33wks -pre-eclampsia - [facility]   Family History: family history includes Cancer in her father, maternal grandfather, and paternal grandfather. Social History:  reports that she has never smoked. She has never used smokeless tobacco. She reports that she drinks alcohol. She reports that she does not use illicit drugs.   Prenatal Transfer Tool  Maternal Diabetes: No Genetic Screening: Normal Maternal Ultrasounds/Referrals: Normal Fetal Ultrasounds or other Referrals:  None Maternal Substance Abuse:  No Significant Maternal Medications:  None Significant Maternal Lab Results:  None Other Comments:  None  Review of Systems  All other systems reviewed and are negative.     Blood pressure 133/99, pulse 106, temperature 98.8 F (37.1 C), temperature source Oral, resp. rate 18, last menstrual period 06/30/2013, SpO2 100.00%. Maternal Exam:  Abdomen: Patient reports no abdominal tenderness. Fetal presentation: vertex  Introitus: not evaluated.     Physical Exam  Nursing note and vitals reviewed. Constitutional: She is oriented to person, place, and time. She appears well-developed and well-nourished.  HENT:  Head: Normocephalic.  Eyes: Conjunctivae are normal. Pupils are equal, round, and reactive to light.  Neck: Normal range of motion. Neck supple.  Cardiovascular: Normal  rate and regular rhythm.   Respiratory: Effort normal.  GI: Soft.  Genitourinary: Vagina normal and uterus normal.  Musculoskeletal: Normal range of motion.  Neurological: She is alert and oriented to person, place, and time.  Skin: Skin is warm and dry.  Psychiatric: She has a normal mood and affect. Her behavior is normal. Judgment and thought content normal.    Prenatal labs: ABO, Rh: --/--/O POS, O POS (06/04 1100) Antibody: NEG (06/04 1100) Rubella: 2.91 (10/28 1134) RPR: NON REAC (06/04 1100)  HBsAg: NEGATIVE (10/28 1134)  HIV: NON REACTIVE (03/06 1119)  GBS: NOT DETECTED (05/14 1442)   Assessment/Plan: 39 weeks.  Previous C/S.  Desires repeat C/S   Brock Bad 03/29/2014, 12:18 PM

## 2014-03-29 NOTE — Anesthesia Preprocedure Evaluation (Signed)
Anesthesia Evaluation  Patient identified by MRN, date of birth, ID band Patient awake    Reviewed: Allergy & Precautions, H&P , NPO status , Patient's Chart, lab work & pertinent test results  Airway Mallampati: III TM Distance: >3 FB Neck ROM: Full    Dental no notable dental hx. (+) Teeth Intact   Pulmonary neg pulmonary ROS,  breath sounds clear to auscultation  Pulmonary exam normal       Cardiovascular hypertension, negative cardio ROS  Rhythm:Regular Rate:Normal     Neuro/Psych    GI/Hepatic Neg liver ROS, GERD-  Medicated and Controlled,  Endo/Other  Morbid obesity  Renal/GU negative Renal ROS  negative genitourinary   Musculoskeletal negative musculoskeletal ROS (+)   Abdominal (+) + obese,   Peds  Hematology  (+) anemia ,   Anesthesia Other Findings   Reproductive/Obstetrics (+) Pregnancy Previous C/Section Hx/o HELLP syndrome                           Anesthesia Physical Anesthesia Plan  ASA: III  Anesthesia Plan: Spinal   Post-op Pain Management:    Induction:   Airway Management Planned: Natural Airway  Additional Equipment:   Intra-op Plan:   Post-operative Plan:   Informed Consent: I have reviewed the patients History and Physical, chart, labs and discussed the procedure including the risks, benefits and alternatives for the proposed anesthesia with the patient or authorized representative who has indicated his/her understanding and acceptance.     Plan Discussed with: Anesthesiologist, CRNA and Surgeon  Anesthesia Plan Comments:         Anesthesia Quick Evaluation

## 2014-03-29 NOTE — Consult Note (Signed)
Neonatology Note:  Attendance at C-section:  I was asked by Dr. Harper to attend this repeat C/S at term. The mother is a G2P1 O pos, GBS neg with an uncomplicated pregnancy. ROM at delivery, fluid clear. Infant vigorous with good spontaneous cry and tone. Needed only minimal bulb suctioning. Ap 8/9. Lungs clear to ausc in DR. To CN to care of Pediatrician.  Aaryn Parrilla C. Magalene Mclear, MD  

## 2014-03-29 NOTE — Anesthesia Postprocedure Evaluation (Signed)
Anesthesia Post Note  Patient: Tina [name]  Procedure(s) Performed: Procedure(s) (LRB): CESAREAN SECTION WITH BILATERAL TUBAL LIGATION (N/A)  Anesthesia type: Spinal  Patient location: PACU  Post pain: Pain level controlled  Post assessment: Post-op Vital signs reviewed  Last Vitals:  Filed Vitals:   03/29/14 1350  BP:   Pulse:   Temp: 36.4 C  Resp:     Post vital signs: Reviewed  Level of consciousness: awake  Complications: No apparent anesthesia complications

## 2014-03-29 NOTE — Lactation Note (Signed)
This note was copied from the chart of Tina Tesslyn Mccague. Lactation Consultation Note  Patient Name: Tina [name] MCEYE'M Date: 03/29/2014 Reason for consult: Initial assessment of this second-time mom and her newborn at 7 hours postpartum. Mom did not breastfeed her first chld so this is first BF experience.  Her first child is 32 yo and was born at 53 weeks.  Her newborn has latched several times since delivery and LATCH scores of 8/9, then 7 per RN assessment.   RN, Tamela Oddi taught mom hand expression at 1406 feeding and mom says she also has seen technique on videos.  LC encouraged STS and currently baby is asleep and STS with mom.  LC also recommends cue feedings.  LC encouraged review of Baby and Me pp 9, 14 and 20-25 for STS and BF information. LC provided [employer] brochure and reviewed [facility] services and list of community and web site resources.    Maternal Data Formula Feeding for Exclusion: No Infant to breast within first hour of birth: Yes Has patient been taught Hand Expression?: Yes (RN, betsy documented at 49) Does the patient have breastfeeding experience prior to this delivery?: No (Mom did not breastfeed her first chld so this is first BF experience)  Feeding Feeding Type: Breast Fed Length of feed: 10 min  LATCH Score/Interventions            Most recent LATCH score=7 but previously 8/9          Lactation Tools Discussed/Used   STS, hand expression, cue feedings  Consult Status Consult Status: Follow-up Date: 03/30/14 Follow-up type: In-patient    Tina [name] 03/29/2014, 8:17 PM

## 2014-03-29 NOTE — Transfer of Care (Signed)
Immediate Anesthesia Transfer of Care Note  Patient: Tina [name]  Procedure(s) Performed: Procedure(s): CESAREAN SECTION WITH BILATERAL TUBAL LIGATION (N/A)  Patient Location: PACU  Anesthesia Type:Spinal  Level of Consciousness: awake, alert  and oriented  Airway & Oxygen Therapy: Patient Spontanous Breathing  Post-op Assessment: Report given to PACU RN  Post vital signs: Reviewed  Complications: No apparent anesthesia complications

## 2014-03-29 NOTE — Op Note (Signed)
Cesarean Section Procedure Note   [name]   03/29/2014  Indications: Scheduled Proceedure/Maternal Request   Pre-operative Diagnosis: Previous Cesarean Section; Desires Sterilization.   Post-operative Diagnosis: Same   Surgeon: Brock Bad  Assistants: Antionette Char  Anesthesia: spinal  Procedure Details:  The patient was seen in the Holding Room. The risks, benefits, complications, treatment options, and expected outcomes were discussed with the patient. The patient concurred with the proposed plan, giving informed consent. The patient was identified as Tina [name] and the procedure verified as C-Section Delivery. A Time Out was held and the above information confirmed.  After induction of anesthesia, the patient was draped and prepped in the usual sterile manner. A transverse incision was made and carried down through the subcutaneous tissue to the fascia. The fascial incision was made and extended transversely. The fascia was separated from the underlying rectus tissue superiorly and inferiorly. The peritoneum was identified and entered. The peritoneal incision was extended longitudinally. The utero-vesical peritoneal reflection was incised transversely and the bladder flap was bluntly freed from the lower uterine segment. A low transverse uterine incision was made. Delivered from cephalic presentation was a 3680 gram living newborn female infant(s). APGAR (1 MIN): 8   APGAR (5 MINS): 9   APGAR (10 MINS):    A cord ph was not sent. The umbilical cord was clamped and cut cord. A sample was obtained for evaluation. The placenta was removed Intact and appeared normal.  The uterine incision was closed with running locked sutures of 0 Monocryl.  Hemostasis was observed.  Attention then turned to the tubal ligation procedure.  The right fallopian tube was grasped in the isthmic area with a Babcock clamp and suture ligated through the mesosalpinx with 0 plain catgut and the section of  tube above the knot was excised and submitted to Pathology.  The tubal stumps were then placed back in there normal anatomic position.  Hemostasis was observed.  The same procedure was performed on the opposite side without complications. The paracolic gutters were irrigated. The parieto peritoneum was closed in a running fashion with 2-0 Vicryl.  The fascia was then reapproximated with running sutures of 0 PDS.  The skin was closed with staples.  Instrument, sponge, and needle counts were correct prior the abdominal closure and were correct at the conclusion of the case.    Findings:  Normal uterus, ovaries and tubes   Estimated Blood Loss:   Total IV Fluids:   Urine Output: 200CC OF clear urine  Specimens:  Placenta to pathology  Complications: no complications  Disposition: PACU - hemodynamically stable.  Maternal Condition: stable   Baby condition / location:  Couplet care / Skin to Skin    Signed: Surgeon(s): Brock Bad, MD Antionette Char, MD

## 2014-03-29 NOTE — Anesthesia Procedure Notes (Signed)
Spinal  Patient location during procedure: OR Start time: 03/29/2014 12:42 PM Staffing Anesthesiologist: Tessica Cupo A. Performed by: anesthesiologist  Preanesthetic Checklist Completed: patient identified, site marked, surgical consent, pre-op evaluation, timeout performed, IV checked, risks and benefits discussed and monitors and equipment checked Spinal Block Patient position: sitting Prep: site prepped and draped and DuraPrep Patient monitoring: heart rate, cardiac monitor, continuous pulse ox and blood pressure Approach: midline Location: L4-5 Injection technique: single-shot Needle Needle type: Sprotte  Needle gauge: 24 G Needle length: 9 cm Needle insertion depth: 6 cm Assessment Sensory level: T4 Additional Notes Patient tolerated procedure well. Adequate sensory level.

## 2014-03-30 LAB — CBC
HCT: 28.3 % — ABNORMAL LOW (ref 36.0–46.0)
Hemoglobin: 9.6 g/dL — ABNORMAL LOW (ref 12.0–15.0)
MCH: 31.5 pg (ref 26.0–34.0)
MCHC: 33.9 g/dL (ref 30.0–36.0)
MCV: 92.8 fL (ref 78.0–100.0)
Platelets: 180 10*3/uL (ref 150–400)
RBC: 3.05 MIL/uL — ABNORMAL LOW (ref 3.87–5.11)
RDW: 13.7 % (ref 11.5–15.5)
WBC: 11.6 10*3/uL — AB (ref 4.0–10.5)

## 2014-03-30 LAB — BIRTH TISSUE RECOVERY COLLECTION (PLACENTA DONATION)

## 2014-03-30 IMAGING — US US OB DETAIL+14 WK
1 series · 12 of 28 positions shown · non-contrast
Comparison: none

[Series 1: us ob detail+14 wk · 0.14mm/px · 12 of 71 slices shown]
[im 3/71]
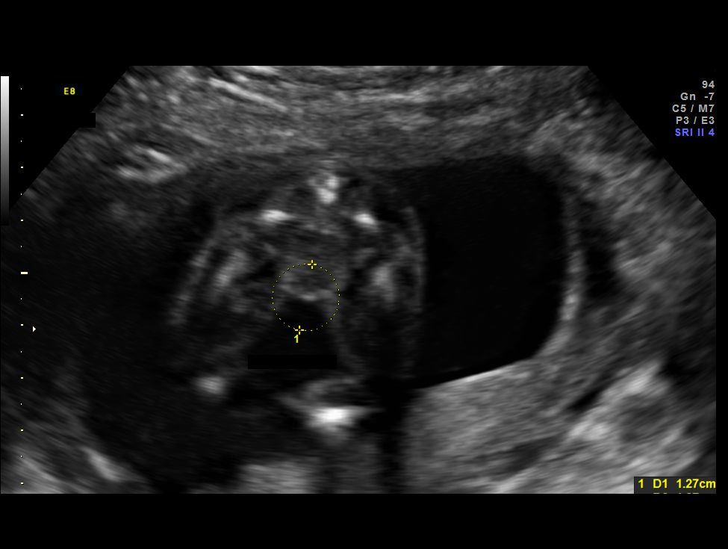
[im 8/71]
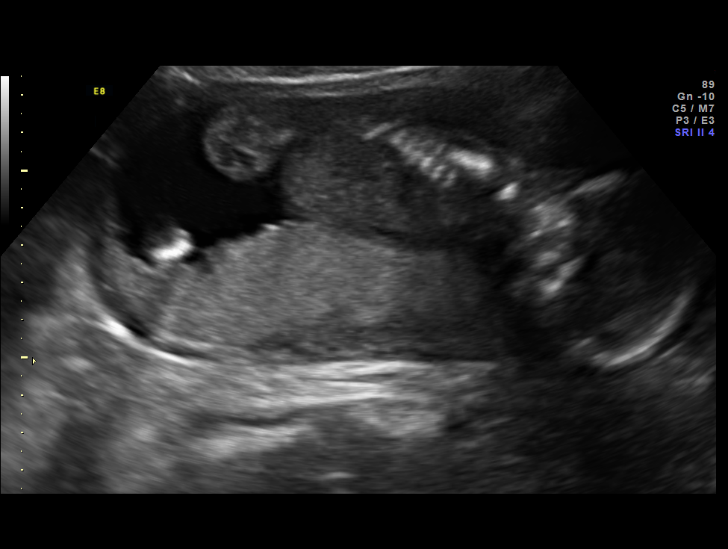
[im 13/71]
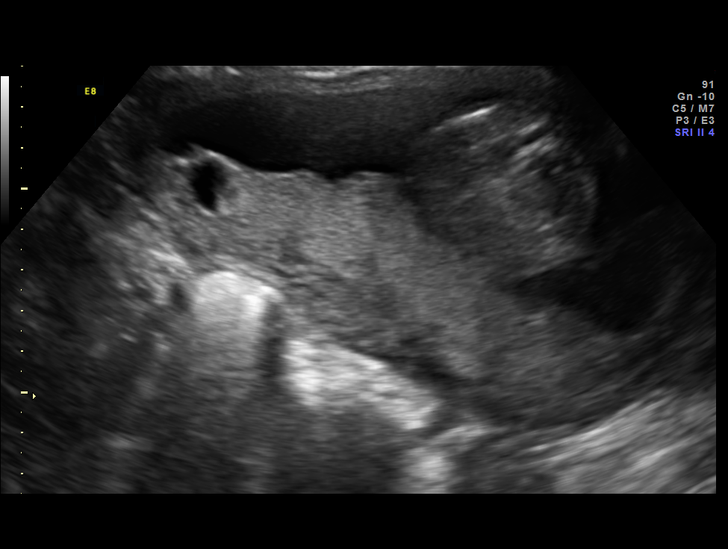
[im 21/71]
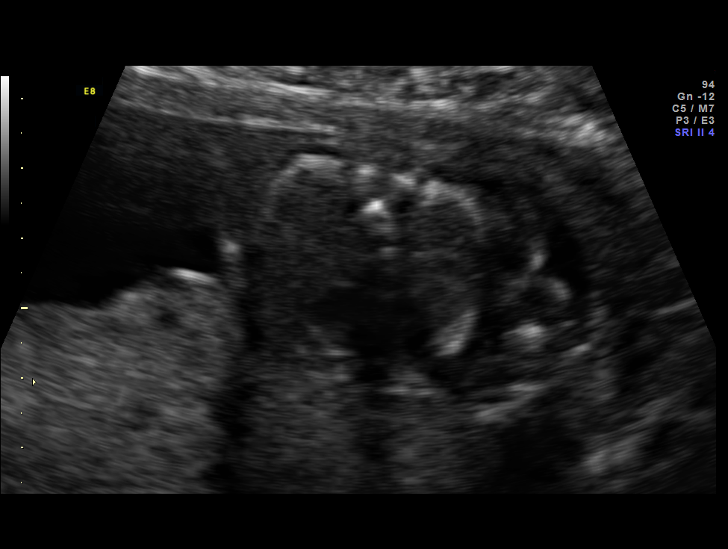
[im 26/71]
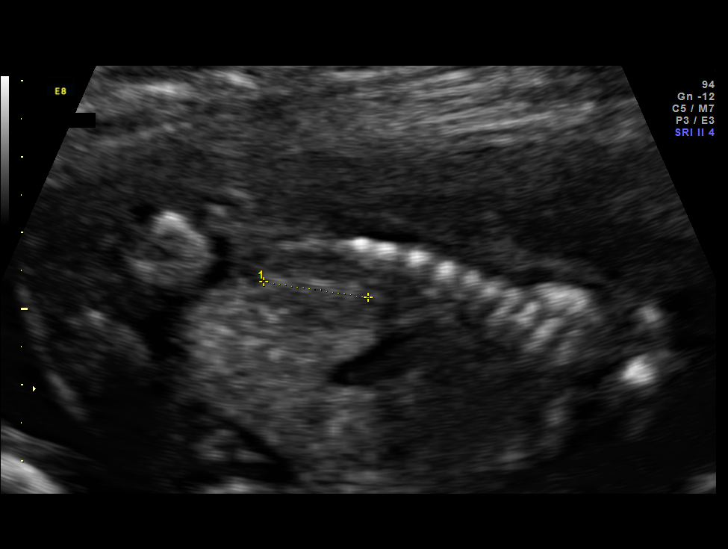
[im 32/71]
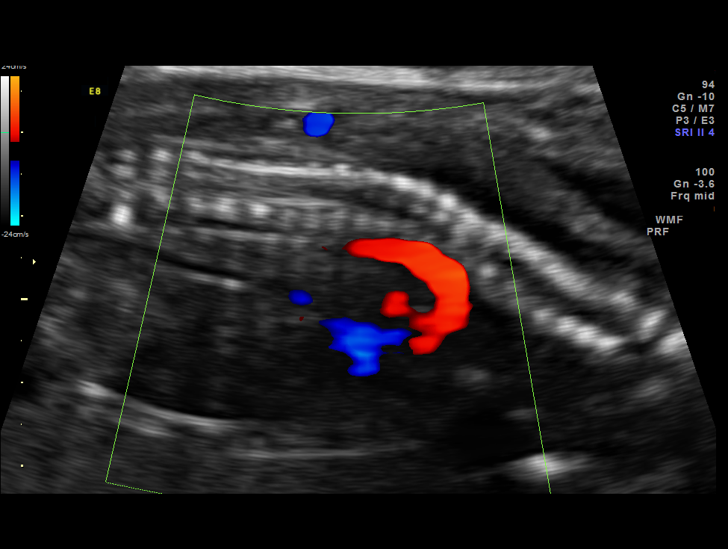
[im 39/71]
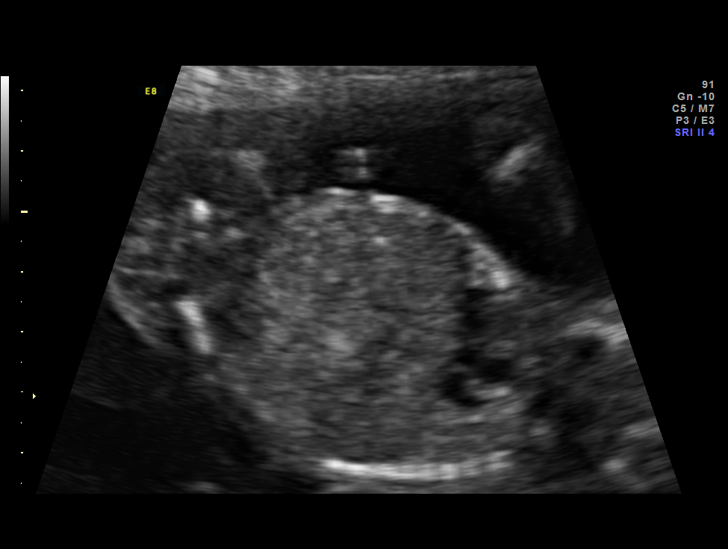
[im 45/71]
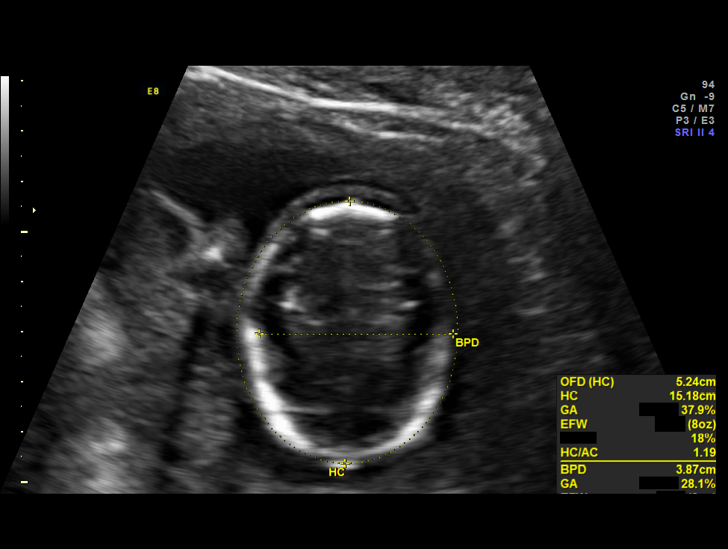
[im 50/71]
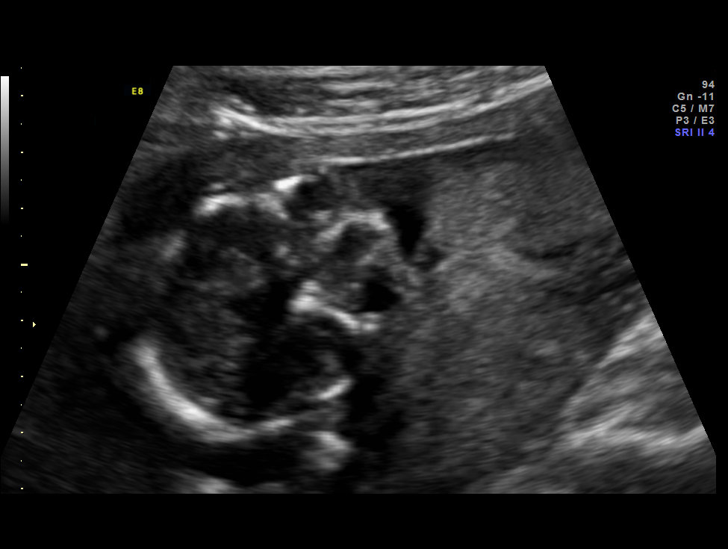
[im 58/71]
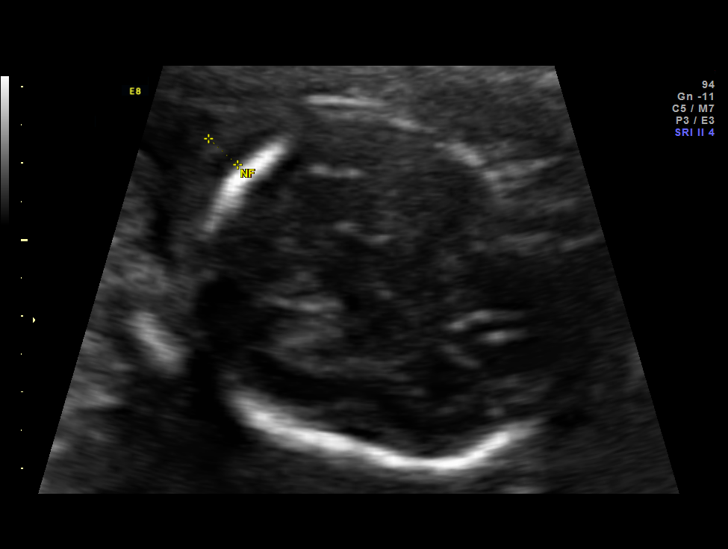
[im 63/71]
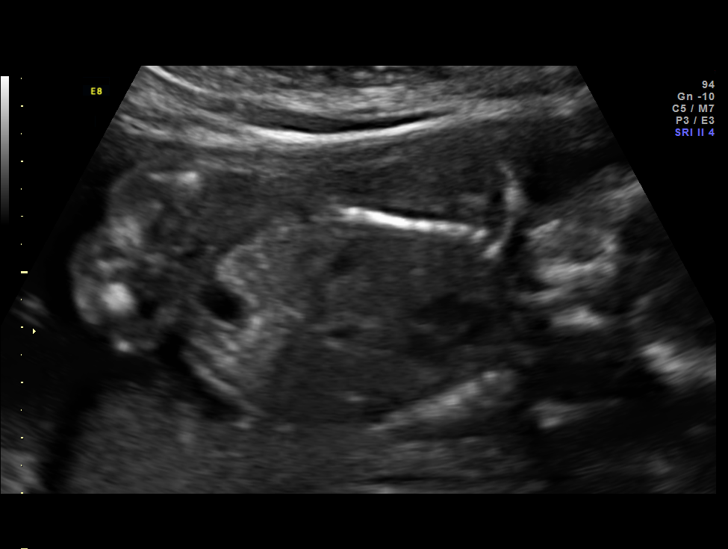
[im 68/71]
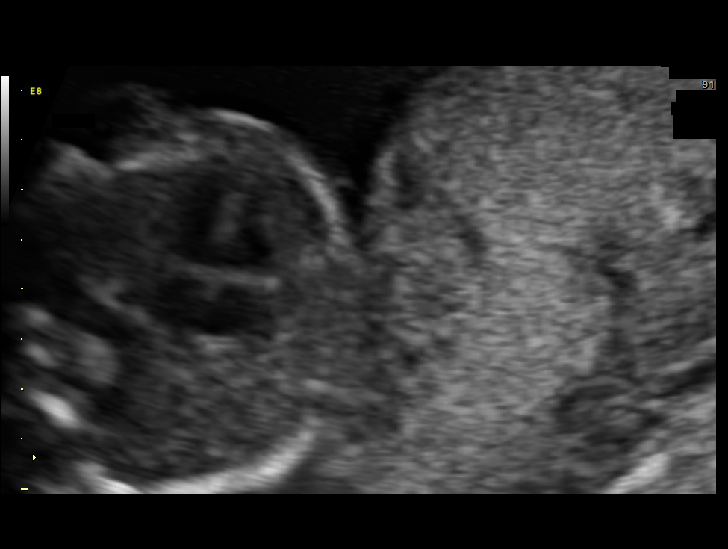

[12 of 28 positions shown; findings below may reference images not displayed]

OBSTETRICS REPORT
                      (Signed Final 11/05/2013 [DATE])

Service(s) Provided

 US OB DETAIL + 14 WK                                  76811.0
Indications

 Detailed fetal anatomic survey
 Poor obstetric history: Previous HELLP syndrome
 Poor obstetric history: Previous preterm delivery
 (31 weeks)
 History of cesarean delivery, currently pregnant      654.20,
Fetal Evaluation

 Num Of Fetuses:    1
 Fetal Heart Rate:  144                          bpm
 Cardiac Activity:  Observed
 Presentation:      Cephalic
 Placenta:          Posterior, above cervical
                    os
 P. Cord            Visualized, central
 Insertion:

 Amniotic Fluid
 AFI FV:      Subjectively within normal limits
                                             Larg Pckt:     4.3  cm
Biometry

 BPD:     39.1  mm     G. Age:  17w 6d                CI:         74.2   70 - 86
 OFD:     52.7  mm                                    FL/HC:      17.8   15.8 -
                                                                         18
 HC:       153  mm     G. Age:  18w 2d       43  %    HC/AC:      1.21   1.07 -

 AC:     126.9  mm     G. Age:  18w 2d       47  %    FL/BPD:
 FL:      27.3  mm     G. Age:  18w 2d       46  %    FL/AC:      21.5   20 - 24
 HUM:     27.1  mm     G. Age:  18w 4d       64  %
 CER:     18.6  mm     G. Age:  18w 2d       50  %
 NFT:      5.3  mm

 Est. FW:     233  gm      0 lb 8 oz     48  %
Gestational Age

 LMP:           18w 2d        Date:  06/30/13                 EDD:   04/06/14
 U/S Today:     18w 1d                                        EDD:   04/07/14
 Best:          18w 2d     Det. By:  LMP  (06/30/13)          EDD:   04/06/14
Anatomy

 Cranium:          Appears normal         Aortic Arch:      Appears normal
 Fetal Cavum:      Not well visualized    Ductal Arch:      Not well visualized
 Ventricles:       Appears normal         Diaphragm:        Appears normal
 Choroid Plexus:   Appears normal         Stomach:          Appears normal
 Cerebellum:       Appears normal         Abdomen:          Appears normal
 Posterior Fossa:  Appears normal         Abdominal Wall:   Appears nml (cord
                                                            insert, abd wall)
 Nuchal Fold:      Appears normal         Cord Vessels:     Appears normal (3
                                                            vessel cord)
 Face:             Orbits appear          Kidneys:          Appear normal
                   normal
 Lips:             Not well visualized    Bladder:          Appears normal
 Heart:            Not well visualized    Spine:            Appears normal
 RVOT:             Not well visualized    Lower             Appears normal
                                          Extremities:
 LVOT:             Not well visualized    Upper             Appears normal
                                          Extremities:

 Other:  Fetus appears to be a female. Heels and 5th digit visualized.
         Technically difficult due to fetal position.
Targeted Anatomy

 Fetal Central Nervous System
 Cisterna Magna:
Cervix Uterus Adnexa

 Cervical Length:    3.2      cm

 Cervix:       Normal appearance by transabdominal scan.
 Left Ovary:    Within normal limits.
 Right Ovary:   Within normal limits.

 Adnexa:     No abnormality visualized.
Impression

 Active SIUP at 04w2d on fetal survey
 EFW 48th percentile
 No dysmorphic features
 Survey is limited by maternal insonating characteristics and
 fetal position with suboptimal evaluation as documented
 above
 No previa
 Amniotic fluid is gestational age appropriate
Recommendations

 Follow up interval growth and attempt to complete fetal
 survey in 4-6 weeks.

 questions or concerns.

## 2014-03-30 MED ORDER — HYDROCODONE-ACETAMINOPHEN 5-325 MG PO TABS
2.0000 | ORAL_TABLET | ORAL | Status: DC | PRN
  Administered 2014-03-30 – 2014-04-01 (×9): 2 via ORAL
  Filled 2014-03-30 (×9): qty 2

## 2014-03-30 MED ORDER — HYDROCODONE-ACETAMINOPHEN 10-325 MG PO TABS: 1.0000 | ORAL_TABLET | ORAL | Status: DC | PRN

## 2014-03-30 NOTE — Lactation Note (Signed)
This note was copied from the chart of Tina Kerria Friederichs. Lactation Consultation Note  Patient Name: Tina [name] [id number] Date: 03/30/2014 Reason for consult: Follow-up assessment of this mom and baby at 32 hours postpartum.  Baby is asleep in mother's arms and mom reports a little nipple tenderness with some feedings today.  Nipples are intact but (L) nipple slightly irritated across tip.  LC encouraged mom to apply ebm before latch and after feedings and ensure deep latch with wide open mouth.  LC also encouraged continued cue feedings and mom to call as needed for latch assistance or breastfeeding questions.   Maternal Data    Feeding    LATCH Score/Interventions Latch: Grasps breast easily, tongue down, lips flanged, rhythmical sucking.  Audible Swallowing: A few with stimulation Intervention(s): Skin to skin;Hand expression  Type of Nipple: Everted at rest and after stimulation  Comfort (Breast/Nipple): Soft / non-tender     Hold (Positioning): No assistance needed to correctly position infant at breast. Intervention(s): Skin to skin;Support Pillows  LATCH Score: 9  (previous feeding assessment by RN)  Lactation Tools Discussed/Used   Nipple care with ebm, exposure of nipples to air and ensuring deep latch Cue feedings  Consult Status Consult Status: Follow-up Date: 03/31/14 Follow-up type: In-patient    Zara Chess 03/30/2014, 10:02 PM

## 2014-03-30 NOTE — Anesthesia Postprocedure Evaluation (Signed)
  Anesthesia Post-op Note  Patient: Tina [name]  Procedure(s) Performed: Procedure(s): CESAREAN SECTION WITH BILATERAL TUBAL LIGATION (N/A)  Patient Location: Mother/Baby  Anesthesia Type:Spinal  Level of Consciousness: awake and alert   Airway and Oxygen Therapy: Patient Spontanous Breathing  Post-op Pain: none  Post-op Assessment: Post-op Vital signs reviewed, Patient's Cardiovascular Status Stable, Respiratory Function Stable, No headache, No backache, No residual numbness and No residual motor weakness  Post-op Vital Signs: Reviewed and stable  Last Vitals:  Filed Vitals:   03/30/14 0330  BP: 121/76  Pulse: 73  Temp: 36.7 C  Resp: 18    Complications: No apparent anesthesia complications

## 2014-03-30 NOTE — Addendum Note (Signed)
Addendum created 03/30/14 0837 by Shanon Payor, CRNA   Modules edited: Notes Section   Notes Section:  File: [id number]

## 2014-03-30 NOTE — Progress Notes (Signed)
Subjective: Postpartum Day 1: Cesarean Delivery Patient reports tolerating PO, + flatus and no problems voiding.    Objective: Vital signs in last 24 hours: Temp:  [97.6 F (36.4 C)-98.8 F (37.1 C)] 98 F (36.7 C) (06/06 0330) Pulse Rate:  [60-124] 73 (06/06 0330) Resp:  [17-25] 18 (06/06 0330) BP: (121-149)/(59-99) 121/76 mmHg (06/06 0330) SpO2:  [93 %-100 %] 94 % (06/06 0330) Weight:  [235 lb (106.595 kg)] 235 lb (106.595 kg) (06/05 1545)  Physical Exam:  General: alert and no distress Lochia: appropriate Uterine Fundus: firm Incision: healing well DVT Evaluation: No evidence of DVT seen on physical exam.   Recent Labs  03/28/14 1100 03/30/14 0605  HGB 10.8* 9.6*  HCT 30.8* 28.3*    Assessment/Plan: Status post Cesarean section. Doing well postoperatively.  Continue current care.  Brock Bad 03/30/2014, 6:44 AM

## 2014-03-31 NOTE — Progress Notes (Signed)
Subjective: Postpartum Day 2: Cesarean Delivery Patient reports tolerating PO, + flatus and no problems voiding.    Objective: Vital signs in last 24 hours: Temp:  [98 F (36.7 C)-98.4 F (36.9 C)] 98.4 F (36.9 C) (06/06 1743) Pulse Rate:  [72-102] 72 (06/06 1743) Resp:  [18-20] 18 (06/06 1743) BP: (125-143)/(82-94) 125/82 mmHg (06/06 1743) SpO2:  [95 %] 95 % (06/06 1300)  Physical Exam:  General: alert and no distress Lochia: appropriate Uterine Fundus: firm Incision: healing well DVT Evaluation: No evidence of DVT seen on physical exam.   Recent Labs  03/28/14 1100 03/30/14 0605  HGB 10.8* 9.6*  HCT 30.8* 28.3*    Assessment/Plan: Status post Cesarean section. Doing well postoperatively.  Continue current care.  Brock Bad 03/31/2014, 6:33 AM

## 2014-03-31 NOTE — Lactation Note (Signed)
This note was copied from the chart of Tina Rachal Kraai. Lactation Consultation Note Follow up visit at 56 hours of age.  Mom reports pain with hand pump and is asking for larger flange.  Left nipple is large and erect, bruised and pink.  #27 flange is a better fit than #24, encouraged colostrum to lubricate pump as needed.  Mom declines assistance with latching on left breast at this time due to pain.  Baby is showing feeding cues.  Minimal assistance to latch baby on right breast in football hold.  Several good swallows heard with wide flanged lips and rhythmic suckling.  Mom denies pain with this feeding.  Report to [facility] RN, mom to call for assist as needed.   Patient Name: Tina [name] [id number] Date: 03/31/2014 Reason for consult: Follow-up assessment;Breast/nipple pain   Maternal Data    Feeding Feeding Type: Breast Fed Length of feed:  (observed 5 minutes)  LATCH Score/Interventions Latch: Grasps breast easily, tongue down, lips flanged, rhythmical sucking. Intervention(s): Breast compression;Assist with latch  Audible Swallowing: Spontaneous and intermittent  Type of Nipple: Everted at rest and after stimulation  Comfort (Breast/Nipple): Filling, red/small blisters or bruises, mild/mod discomfort     Hold (Positioning): Assistance needed to correctly position infant at breast and maintain latch. Intervention(s): Breastfeeding basics reviewed;Support Pillows;Position options;Skin to skin  LATCH Score: 8  Lactation Tools Discussed/Used Tools: Flanges Flange Size: 27   Consult Status Consult Status: Follow-up Date: 04/01/14 Follow-up type: In-patient    Arvella Merles Efton Thomley 03/31/2014, 9:28 PM

## 2014-04-01 ENCOUNTER — Encounter (HOSPITAL_COMMUNITY): Payer: Self-pay | Admitting: Obstetrics

## 2014-04-01 MED ORDER — IBUPROFEN 600 MG PO TABS: 600.0000 mg | ORAL_TABLET | Freq: Four times a day (QID) | ORAL | Status: DC | PRN

## 2014-04-01 MED ORDER — HYDROCODONE-ACETAMINOPHEN 5-325 MG PO TABS: 2.0000 | ORAL_TABLET | ORAL | Status: DC | PRN

## 2014-04-01 MED ORDER — FUSION PLUS PO CAPS: 1.0000 | ORAL_CAPSULE | Freq: Every day | ORAL | Status: DC

## 2014-04-01 NOTE — Progress Notes (Signed)
Subjective: Postpartum Day 3: Cesarean Delivery Patient reports tolerating PO, + flatus, + BM and no problems voiding.    Objective: Vital signs in last 24 hours: Temp:  [97.3 F (36.3 C)-98.3 F (36.8 C)] 98.3 F (36.8 C) (06/08 0554) Pulse Rate:  [68-87] 76 (06/08 0554) Resp:  [18-20] 18 (06/08 0554) BP: (126-143)/(84-87) 126/84 mmHg (06/08 0554)  Physical Exam:  General: alert and no distress Lochia: appropriate Uterine Fundus: firm Incision: healing well DVT Evaluation: No evidence of DVT seen on physical exam.   Recent Labs  03/30/14 0605  HGB 9.6*  HCT 28.3*    Assessment/Plan: Status post Cesarean section. Doing well postoperatively.  Anemia.  Clinically stable.  Iron Rx. Discharge home with standard precautions and return to clinic in 4 days for removal of staples.  Brock Bad 04/01/2014, 6:44 AM

## 2014-04-01 NOTE — Lactation Note (Signed)
This note was copied from the chart of Tina Betul Mudrak. Lactation Consultation Note  Follow up consult;  Mother has baby latched in fb upon entering room with Devin RN and right breast.   Sucks and swallows observed some with stimulation.    Mother states her left breast is sore and compressed after bf and [address] has reviewed how to achieve a deeper latch last night. Mother is pumping with a hand pump on the left side for stimulation.  Recently pumped approx 30 ml with hand pump on left. Mother states she has a DEBP at home.  Encouraged her to continue to try bf on left side once she feels more comfortable. Reviewed engorgement care and breast massage. Encouraged her to call if left breast continues to be sore when breastfeeding.    Patient Name: Tina [name] [id number] Date: 04/01/2014 Reason for consult: Follow-up assessment   Maternal Data    Feeding Feeding Type: Breast Fed Length of feed: 20 min  LATCH Score/Interventions Latch: Grasps breast easily, tongue down, lips flanged, rhythmical sucking. (latched upon entering room)  Audible Swallowing: A few with stimulation  Type of Nipple: Everted at rest and after stimulation  Comfort (Breast/Nipple): Soft / non-tender     Hold (Positioning): No assistance needed to correctly position infant at breast.  LATCH Score: 9  Lactation Tools Discussed/Used     Consult Status Consult Status: Complete    Tina [name] 04/01/2014, 9:25 AM

## 2014-04-01 NOTE — Lactation Note (Signed)
This note was copied from the chart of Tina Shontel Beranek. Lactation Consultation Note  Provided mother with volume guidelines and a foley cup for supplementing with breastmilk.  Reviewed foley cup usage.  Reminded parents to monitor voids/stools and look at chart in Baby & Me booklet.  Encouraged parents to call with further questions.    Patient Name: Tina [name] Date: 04/01/2014 Reason for consult: Follow-up assessment   Maternal Data    Feeding Feeding Type: Breast Fed Length of feed: 20 min  LATCH Score/Interventions Latch: Grasps breast easily, tongue down, lips flanged, rhythmical sucking. (latched upon entering room)  Audible Swallowing: A few with stimulation  Type of Nipple: Everted at rest and after stimulation  Comfort (Breast/Nipple): Filling, red/small blisters or bruises, mild/mod discomfort  Problem noted: Mild/Moderate discomfort Interventions (Mild/moderate discomfort): Comfort gels;Hand expression;Post-pump;Pre-pump if needed  Hold (Positioning): No assistance needed to correctly position infant at breast.  LATCH Score: 8  Lactation Tools Discussed/Used     Consult Status Consult Status: Complete    Hardie Pulley 04/01/2014, 10:17 AM

## 2014-04-01 NOTE — Discharge Summary (Signed)
Obstetric Discharge Summary Reason for Admission: cesarean section Prenatal Procedures: ultrasound Intrapartum Procedures: cesarean: low cervical, transverse Postpartum Procedures: none Complications-Operative and Postpartum: none Hemoglobin  Date Value Ref Range Status  03/30/2014 9.6* 12.0 - 15.0 g/dL Final     HCT  Date Value Ref Range Status  03/30/2014 28.3* 36.0 - 46.0 % Final    Physical Exam:  General: alert and no distress Lochia: appropriate Uterine Fundus: firm Incision: healing well DVT Evaluation: No evidence of DVT seen on physical exam.  Discharge Diagnoses: Term Pregnancy-delivered and anemia, clinically stable.  Discharge Information: Date: 04/01/2014 Activity: pelvic rest Diet: routine Medications: PNV, Ibuprofen, Colace, Iron and Vicodin Condition: stable Instructions: refer to practice specific booklet Discharge to: home Follow-up Information   Follow up with Joda Braatz A, MD In 4 days. (For wound re-check and removal of staples.)    Specialty:  Obstetrics and Gynecology   Contact information:   [address] [place] [address] [phone]       Newborn Data: Live born female  Birth Weight: 8 lb 1.8 oz (3680 g) APGAR: 8, 9  Home with mother.  Brock Bad 04/01/2014, 6:52 AM

## 2014-04-03 ENCOUNTER — Ambulatory Visit (HOSPITAL_COMMUNITY)
Admission: RE | Admit: 2014-04-03 | Discharge: 2014-04-03 | Disposition: A | Discharge status: Home / Self Care | Payer: Managed Care, Other (non HMO) | Source: Ambulatory Visit | Attending: Obstetrics | Admitting: Obstetrics

## 2014-04-03 NOTE — Lactation Note (Signed)
Adult Lactation Consultation Outpatient Visit Note; Mom here today because of very sore nipples. Has not been putting the baby to the left breast because it was to sore.   Patient Name: Tina [name] Date of Birth: June 06, 1981 Gestational Age at Delivery: Unknown Type of Delivery:   Breastfeeding History: Frequency of Breastfeeding: q 2-3 hours Length of Feeding: 10- 15 min Voids: QS- had void while here for appointment Stools: QS  Supplementing / Method: Pumping:  Type of Pump:Medela   Frequency:3-4 times/day  Volume:  1-2 oz  Comments:    Consultation Evaluation:. Both nipples scabbed and raw on tips. Mom reports that left nipple is the worse.  Initial Feeding Assessment: Pre-feed Weight: 7- 8.7  3420g Post-feed Weight: 7- 70.1  3460g Amount Transferred: 40 Comments: Assisted mom with latch in football position. Mom got deeper latch and reports that feels much better. Breasts are full especially the left.Reviewed wide open mouth and having the baby close to the breast throughout the feeding. Both parents very receptive to teaching.Lots of swallows heard and  mom reports that breast feels softer.   Additional Feeding Assessment: Pre-feed Weight: 7- 10.1  3460g Post-feed Weight: 7- 11.3  3496g Amount Transferred:36 Comments: Latched Lauryn to left breast in cross cradle position and mom reports that feels good. No pain. Breast softer after nursing. Comfort gels given with instructions- Placed on nipples and mom reports they feel great! No further questions at present.  Total Breast milk Transferred this Visit: 76 cc's Total Supplement Given: 0  Additional Interventions: To pump for comfort Comfort gels  Follow-Up With Ped To call here if wants another appointment/ or questions BFS[facility]     Pamelia Hoit 04/03/2014, 4:53 PM

## 2014-04-04 ENCOUNTER — Encounter (HOSPITAL_COMMUNITY): Payer: Self-pay | Admitting: General Practice

## 2014-04-04 ENCOUNTER — Inpatient Hospital Stay (HOSPITAL_COMMUNITY)
Admission: AD | Admit: 2014-04-04 | Discharge: 2014-04-04 | Disposition: A | Discharge status: Home / Self Care | Payer: Managed Care, Other (non HMO) | Source: Ambulatory Visit | Attending: Obstetrics | Admitting: Obstetrics

## 2014-04-04 ENCOUNTER — Ambulatory Visit (INDEPENDENT_AMBULATORY_CARE_PROVIDER_SITE_OTHER): Payer: Managed Care, Other (non HMO) | Admitting: Obstetrics

## 2014-04-04 ENCOUNTER — Encounter: Payer: Self-pay | Admitting: Obstetrics

## 2014-04-04 DIAGNOSIS — O135 Gestational [pregnancy-induced] hypertension without significant proteinuria, complicating the puerperium: Secondary | ICD-10-CM | POA: Insufficient documentation

## 2014-04-04 DIAGNOSIS — O165 Unspecified maternal hypertension, complicating the puerperium: Secondary | ICD-10-CM

## 2014-04-04 DIAGNOSIS — K219 Gastro-esophageal reflux disease without esophagitis: Secondary | ICD-10-CM | POA: Insufficient documentation

## 2014-04-04 LAB — COMPREHENSIVE METABOLIC PANEL
ALT: 27 U/L (ref 0–35)
AST: 32 U/L (ref 0–37)
Albumin: 2.8 g/dL — ABNORMAL LOW (ref 3.5–5.2)
Alkaline Phosphatase: 115 U/L (ref 39–117)
BUN: 14 mg/dL (ref 6–23)
CHLORIDE: 102 meq/L (ref 96–112)
CO2: 25 meq/L (ref 19–32)
CREATININE: 0.83 mg/dL (ref 0.50–1.10)
Calcium: 9.5 mg/dL (ref 8.4–10.5)
GFR calc Af Amer: >90 mL/min (ref >90)
Glucose, Bld: 81 mg/dL (ref 70–99)
Potassium: 4.3 mEq/L (ref 3.7–5.3)
Sodium: 139 mEq/L (ref 137–147)
Total Bilirubin: 0.3 mg/dL (ref 0.3–1.2)
Total Protein: 6 g/dL (ref 6.0–8.3)

## 2014-04-04 LAB — CBC
HEMATOCRIT: 30 % — AB (ref 36.0–46.0)
Hemoglobin: 10 g/dL — ABNORMAL LOW (ref 12.0–15.0)
MCH: 31.3 pg (ref 26.0–34.0)
MCHC: 33.3 g/dL (ref 30.0–36.0)
MCV: 93.8 fL (ref 78.0–100.0)
Platelets: 301 10*3/uL (ref 150–400)
RBC: 3.2 MIL/uL — AB (ref 3.87–5.11)
RDW: 14.2 % (ref 11.5–15.5)
WBC: 7.7 10*3/uL (ref 4.0–10.5)

## 2014-04-04 LAB — URINALYSIS, ROUTINE W REFLEX MICROSCOPIC
BILIRUBIN URINE: NEGATIVE
GLUCOSE, UA: NEGATIVE mg/dL
Ketones, ur: 15 mg/dL — AB
Leukocytes, UA: NEGATIVE
Nitrite: NEGATIVE
Protein, ur: 100 mg/dL — AB
SPECIFIC GRAVITY, URINE: 1.025 (ref 1.005–1.030)
Urobilinogen, UA: 0.2 mg/dL (ref 0.0–1.0)
pH: 6 (ref 5.0–8.0)

## 2014-04-04 LAB — PROTEIN / CREATININE RATIO, URINE
CREATININE, URINE: 282.51 mg/dL
Protein Creatinine Ratio: 0.2 — ABNORMAL HIGH (ref 0.00–0.15)
Total Protein, Urine: 57.8 mg/dL

## 2014-04-04 LAB — URINE MICROSCOPIC-ADD ON

## 2014-04-04 LAB — URIC ACID: URIC ACID, SERUM: 6.9 mg/dL (ref 2.4–7.0)

## 2014-04-04 MED ORDER — HYDROCHLOROTHIAZIDE 25 MG PO TABS
25.0000 mg | ORAL_TABLET | Freq: Every day | ORAL | Status: DC
Start: 2014-04-04 — End: 2014-08-15

## 2014-04-04 NOTE — MAU Provider Note (Signed)
History     CSN: [id number]  Arrival date and time: 04/04/14 1651   First Provider Initiated Contact with Patient 04/04/14 1746      Chief Complaint  Patient presents with  . Hypertension   HPI Tina [name] is a 33 y.o. G15P1102 female who was sent from office for elevated BP. S/p repeat c/section on 03/29/14. Has history of HELLP in 2005. Denies hypertension outside of pregnancy or during this most recent pregnancy.  Denies headache, vision changes, epigastric pain, nausea/vomiting. Has had edema since end of pregnancy.     Past Medical History  Diagnosis Date  . Pre-eclampsia 08/2004    History only - resolved  . GERD (gastroesophageal reflux disease)     Past Surgical History  Procedure Laterality Date  . Cesarean section  08/2004    33wks -pre-eclampsia - [facility]  . Cesarean section with bilateral tubal ligation N/A 03/29/2014    Procedure: CESAREAN SECTION WITH BILATERAL TUBAL LIGATION;  Surgeon: Brock Bad, MD;  Location: [facility];  Service: Obstetrics;  Laterality: N/A;    Family History  Problem Relation Age of Onset  . Cancer Father   . Cancer Maternal Grandfather   . Cancer Paternal Grandfather     History  Substance Use Topics  . Smoking status: Never Smoker   . Smokeless tobacco: Never Used  . Alcohol Use: Yes     Comment: socially but none with pregnancy    Allergies:  Allergies  Allergen Reactions  . Percocet [Oxycodone-Acetaminophen] Nausea And Vomiting    Lots of vomiting; may take vicodin (has had before)    Prescriptions prior to admission  Medication Sig Dispense Refill  . HYDROcodone-acetaminophen (NORCO/VICODIN) 5-325 MG per tablet Take 2 tablets by mouth every 4 (four) hours as needed for moderate pain.  40 tablet  0  . ibuprofen (ADVIL,MOTRIN) 600 MG tablet Take 1 tablet (600 mg total) by mouth every 6 (six) hours as needed.  30 tablet  5  . omeprazole (PRILOSEC) 20 MG capsule Take 1 capsule (20 mg total) by mouth 2 (two) times daily  before a meal.  60 capsule  5    Review of Systems  Constitutional: Negative.   Eyes: Negative for blurred vision and double vision.  Respiratory: Negative.   Cardiovascular: Negative.   Gastrointestinal: Negative for nausea, vomiting and abdominal pain.  Neurological: Negative for headaches.   Physical Exam   Blood pressure 153/99, pulse 71, temperature 98.1 F (36.7 C), temperature source Oral, resp. rate 18, height 5\' 3"  (1.6 m), weight 222 lb 12.8 oz (101.061 kg), currently breastfeeding.  Patient Vitals for the past 24 hrs:  BP Temp Temp src Pulse Resp Height Weight  04/04/14 1918 151/88 mmHg - - 71 - - -  04/04/14 1903 150/87 mmHg - - 73 - - -  04/04/14 1850 153/89 mmHg - - 67 - - -  04/04/14 1803 156/101 mmHg - - 79 - - -  04/04/14 1748 147/88 mmHg - - 73 - - -  04/04/14 1733 156/94 mmHg - - 78 - - -  04/04/14 1723 153/99 mmHg - - 71 18 - -  04/04/14 1707 147/96 mmHg 98.1 F (36.7 C) Oral 65 18 5\' 3"  (1.6 m) 222 lb 12.8 oz (101.061 kg)     Physical Exam  Constitutional: She is oriented to person, place, and time. She appears well-developed and well-nourished. No distress.  Cardiovascular: Normal rate, regular rhythm and normal heart sounds.   Respiratory: Effort normal and breath sounds  normal. No respiratory distress. She has no wheezes.  GI: Soft. There is no tenderness.  Neurological: She is alert and oriented to person, place, and time. She displays normal reflexes.  Reflex Scores:      Patellar reflexes are 2+ on the right side and 2+ on the left side. No clonus  Skin: Skin is warm and dry.  Psychiatric: She has a normal mood and affect. Her behavior is normal. Judgment and thought content normal.    MAU Course  Procedures Results for orders placed during the hospital encounter of 04/04/14 (from the past 24 hour(s))  URINALYSIS, ROUTINE W REFLEX MICROSCOPIC     Status: Abnormal   Collection Time    04/04/14  5:03 PM      Result Value Ref Range   Color,  Urine YELLOW  YELLOW   APPearance CLEAR  CLEAR   Specific Gravity, Urine 1.025  1.005 - 1.030   pH 6.0  5.0 - 8.0   Glucose, UA NEGATIVE  NEGATIVE mg/dL   Hgb urine dipstick LARGE (*) NEGATIVE   Bilirubin Urine NEGATIVE  NEGATIVE   Ketones, ur 15 (*) NEGATIVE mg/dL   Protein, ur 161100 (*) NEGATIVE mg/dL   Urobilinogen, UA 0.2  0.0 - 1.0 mg/dL   Nitrite NEGATIVE  NEGATIVE   Leukocytes, UA NEGATIVE  NEGATIVE  PROTEIN / CREATININE RATIO, URINE     Status: Abnormal   Collection Time    04/04/14  5:03 PM      Result Value Ref Range   Creatinine, Urine 282.51     Total Protein, Urine 57.8     PROTEIN CREATININE RATIO 0.20 (*) 0.00 - 0.15  URINE MICROSCOPIC-ADD ON     Status: Abnormal   Collection Time    04/04/14  5:03 PM      Result Value Ref Range   Squamous Epithelial / LPF FEW (*) RARE   WBC, UA 3-6  <3 WBC/hpf   RBC / HPF 7-10  <3 RBC/hpf   Bacteria, UA FEW (*) RARE   Urine-Other MUCOUS PRESENT    CBC     Status: Abnormal   Collection Time    04/04/14  6:44 PM      Result Value Ref Range   WBC 7.7  4.0 - 10.5 K/uL   RBC 3.20 (*) 3.87 - 5.11 MIL/uL   Hemoglobin 10.0 (*) 12.0 - 15.0 g/dL   HCT 09.630.0 (*) 04.536.0 - 40.946.0 %   MCV 93.8  78.0 - 100.0 fL   MCH 31.3  26.0 - 34.0 pg   MCHC 33.3  30.0 - 36.0 g/dL   RDW 81.114.2  91.411.5 - 78.215.5 %   Platelets 301  150 - 400 K/uL  COMPREHENSIVE METABOLIC PANEL     Status: Abnormal   Collection Time    04/04/14  6:44 PM      Result Value Ref Range   Sodium 139  137 - 147 mEq/L   Potassium 4.3  3.7 - 5.3 mEq/L   Chloride 102  96 - 112 mEq/L   CO2 25  19 - 32 mEq/L   Glucose, Bld 81  70 - 99 mg/dL   BUN 14  6 - 23 mg/dL   Creatinine, Ser 9.560.83  0.50 - 1.10 mg/dL   Calcium 9.5  8.4 - 21.310.5 mg/dL   Total Protein 6.0  6.0 - 8.3 g/dL   Albumin 2.8 (*) 3.5 - 5.2 g/dL   AST 32  0 - 37 U/L   ALT 27  0 - 35 U/L   Alkaline Phosphatase 115  39 - 117 U/L   Total Bilirubin 0.3  0.3 - 1.2 mg/dL   GFR calc non Af Amer >90  >90 mL/min   GFR calc Af Amer  >90  >90 mL/min  URIC ACID     Status: None   Collection Time    04/04/14  6:44 PM      Result Value Ref Range   Uric Acid, Serum 6.9  2.4 - 7.0 mg/dL      Assessment and Plan  A: 1. Postpartum hypertension     P: Discharge home F/u with [facility] next week Discussed reasons to return to MAU Rx HCTZ  Claudie Revering, Student-NP  04/04/2014, 5:39 PM   Seen and examined by me Agree with note Aviva Signs, CNM

## 2014-04-04 NOTE — MAU Note (Signed)
Pt sent from office with elevated b/ps 157/106. Delivered on 03/29/14. Has hx of HELP with last pregnancy. Pt denies headache or vision changes at this time.

## 2014-04-04 NOTE — Progress Notes (Signed)
Patient ID: Tina [name], female   DOB: Feb 21, 1981, 33 y.o.   MRN: [id number] Patient is 6 days post partum and is in the office for staple removal. Patient has elevated BP in both arms. Dr Clearance Coots has left the office to deliver and per Dr Tamela Oddi staples have been removed. Incision is clean and dry and steristrips have been placed. MAU has been notified and will call doctor for order. Patient to follow up in the office for 6 week postpartum.

## 2014-04-04 NOTE — Discharge Instructions (Signed)
Hypertension During Pregnancy °Hypertension is also called high blood pressure. Blood pressure moves blood in your body. Sometimes, the force that moves the blood becomes too strong. When you are pregnant, this condition should be watched carefully. It can cause problems for you and your baby. °HOME CARE  °· Make and keep all of your doctor visits. °· Take medicine as told by your doctor. Tell your doctor about all medicines you take. °· Eat very little salt. °· Exercise regularly. °· Do not drink alcohol. °· Do not smoke. °· Do not have drinks with caffeine. °· Lie on your left side when resting. °GET HELP RIGHT AWAY IF: °· You have bad belly (abdominal) pain. °· You have sudden puffiness (swelling) in the hands, ankles, or face. °· You gain 4 pounds (1.8 kilograms) or more in 1 week. °· You throw up (vomit) repeatedly. °· You have bleeding from the vagina. °· You do not feel the baby moving as much. °· You have a headache. °· You have blurred or double vision. °· You have muscle twitching or spasms. °· You have shortness of breath. °· You have blue fingernails and lips. °· You have blood in your pee (urine). °MAKE SURE YOU: °· Understand these instructions. °· Will watch your condition. °· Will get help right away if you are not doing well or get worse. °Document Released: 11/13/2010 Document Revised: 08/01/2013 Document Reviewed: 05/10/2013 °ExitCare® Patient Information ©2014 [employer]. ° °

## 2014-04-05 ENCOUNTER — Encounter: Payer: Self-pay | Admitting: Obstetrics

## 2014-04-05 DIAGNOSIS — O165 Unspecified maternal hypertension, complicating the puerperium: Secondary | ICD-10-CM | POA: Insufficient documentation

## 2014-05-16 ENCOUNTER — Ambulatory Visit (INDEPENDENT_AMBULATORY_CARE_PROVIDER_SITE_OTHER): Payer: Managed Care, Other (non HMO) | Admitting: Obstetrics

## 2014-05-16 ENCOUNTER — Encounter: Payer: Self-pay | Admitting: *Deleted

## 2014-05-16 NOTE — Progress Notes (Signed)
Subjective:     Tina [name] is a 33 y.o. female who presents for a postpartum visit. She is 7 weeks postpartum following a low cervical transverse Cesarean section. I have fully reviewed the prenatal and intrapartum course. The delivery was at 39 gestational weeks. Outcome: repeat cesarean section, low transverse incision. Anesthesia: spinal. Postpartum course has been normal. Baby's course has been normal. Baby is feeding by breast. Bleeding no bleeding. Bowel function is normal. Bladder function is normal. Patient is not sexually active. Contraception method is tubal ligation. Postpartum depression screening: negative.  Tobacco, alcohol and substance abuse history reviewed.  Adult immunizations reviewed including TDAP, rubella and varicella.  The following portions of the patient's history were reviewed and updated as appropriate: allergies, current medications, past family history, past medical history, past social history, past surgical history and problem list.  Review of Systems A comprehensive review of systems was negative.   Objective:    There were no vitals taken for this visit.  General:  alert and no distress   Breasts:  inspection negative, no nipple discharge or bleeding, no masses or nodularity palpable  Lungs: clear to auscultation bilaterally  Heart:  regular rate and rhythm, S1, S2 normal, no murmur, click, rub or gallop  Abdomen: normal findings: soft, non-tender   Vulva:  normal  Vagina: normal vagina  Cervix:  no lesions  Corpus: normal size, contour, position, consistency, mobility, non-tender  Adnexa:  no mass, fullness, tenderness  Rectal Exam: Normal rectovaginal exam           Assessment:     Normal postpartum exam. Pap smear not done at today's visit.  Plan:    1. Contraception: tubal ligation 2. Continue PNV's 3. Follow up in: 3 months or as needed.

## 2014-08-15 ENCOUNTER — Ambulatory Visit (INDEPENDENT_AMBULATORY_CARE_PROVIDER_SITE_OTHER): Payer: Managed Care, Other (non HMO) | Admitting: Obstetrics

## 2014-08-15 ENCOUNTER — Encounter: Payer: Self-pay | Admitting: Obstetrics

## 2014-08-15 VITALS — BP 126/84 | HR 74 | Temp 98.2°F | Ht 63.0 in | Wt 192.0 lb

## 2014-08-15 DIAGNOSIS — Z01419 Encounter for gynecological examination (general) (routine) without abnormal findings: Secondary | ICD-10-CM

## 2014-08-15 DIAGNOSIS — Z0000 Encounter for general adult medical examination without abnormal findings: Secondary | ICD-10-CM

## 2014-08-15 NOTE — Addendum Note (Signed)
Addended by: Odessa FlemingBOHNE, Sael Furches M on: 08/15/2014 04:41 PM   Modules accepted: Orders

## 2014-08-15 NOTE — Progress Notes (Signed)
Subjective:     Tina [name] is a 33 y.o. female here for a routine exam.  Current complaints: none.    Personal health questionnaire:  Is patient Tina [name], have a family history of breast and/or ovarian cancer: no Is there a family history of uterine cancer diagnosed at age < 8550, gastrointestinal cancer, urinary tract cancer, family member who is a Personnel officerLynch syndrome-associated carrier: no Is the patient overweight and hypertensive, family history of diabetes, personal history of gestational diabetes or PCOS: no Is patient over 3355, have PCOS,  family history of premature CHD under age 32, diabetes, smoke, have hypertension or peripheral artery disease:  no At any time, has a partner hit, kicked or otherwise hurt or frightened you?: no Over the past 2 weeks, have you felt down, depressed or hopeless?: no Over the past 2 weeks, have you felt little interest or pleasure in doing things?:no   Gynecologic History Patient's last menstrual period was 08/01/2014. Contraception: tubal ligation Last Pap: 2014. Results were: normal Last mammogram: n/a. Results were: n/a  Obstetric History OB History  Gravida Para Term Preterm AB SAB TAB Ectopic Multiple Living  2 2 1 1  0 0 0 0 0 2    # Outcome Date GA Lbr Len/2nd Weight Sex Delivery Anes PTL Lv  2 TRM 03/29/14 3040w2d  8 lb 1.8 oz (3.68 kg) F LTCS Spinal  Y  1 PRE 09/15/04 6957w0d  3 lb 4 oz (1.474 kg) F LTCS Spinal Y Y     Comments: Pre-eclapsia      Past Medical History  Diagnosis Date  . Pre-eclampsia 08/2004    History only - resolved  . GERD (gastroesophageal reflux disease)     Past Surgical History  Procedure Laterality Date  . Cesarean section  08/2004    33wks -pre-eclampsia - [facility]  . Cesarean section with bilateral tubal ligation N/A 03/29/2014    Procedure: CESAREAN SECTION WITH BILATERAL TUBAL LIGATION;  Surgeon: Brock Badharles A Analaya Hoey, MD;  Location: [facility];  Service: Obstetrics;  Laterality: N/A;    Current outpatient  prescriptions:Multiple Vitamins-Minerals (MULTIVITAMIN WITH MINERALS) tablet, Take 1 tablet by mouth daily., Disp: , Rfl:  Allergies  Allergen Reactions  . Percocet [Oxycodone-Acetaminophen] Nausea And Vomiting    Lots of vomiting; may take vicodin (has had before)    History  Substance Use Topics  . Smoking status: Never Smoker   . Smokeless tobacco: Never Used  . Alcohol Use: No    Family History  Problem Relation Age of Onset  . Cancer Father   . Cancer Maternal Grandfather   . Cancer Paternal Grandfather       Review of Systems  Constitutional: negative for fatigue and weight loss Respiratory: negative for cough and wheezing Cardiovascular: negative for chest pain, fatigue and palpitations Gastrointestinal: negative for abdominal pain and change in bowel habits Musculoskeletal:negative for myalgias Neurological: negative for gait problems and tremors Behavioral/Psych: negative for abusive relationship, depression Endocrine: negative for temperature intolerance   Genitourinary:negative for abnormal menstrual periods, genital lesions, hot flashes, sexual problems and vaginal discharge Integument/breast: negative for breast lump, breast tenderness, nipple discharge and skin lesion(s)    Objective:       BP 126/84  Pulse 74  Temp(Src) 98.2 F (36.8 C)  Ht 5\' 3"  (1.6 m)  Wt 192 lb (87.091 kg)  BMI 34.02 kg/m2  LMP 08/01/2014  Breastfeeding? Yes General:   alert  Skin:   no rash or abnormalities  Lungs:   clear to auscultation  bilaterally  Heart:   regular rate and rhythm, S1, S2 normal, no murmur, click, rub or gallop  Breasts:   normal without suspicious masses, skin or nipple changes or axillary nodes  Abdomen:  normal findings: no organomegaly, soft, non-tender and no hernia  Pelvis:  External genitalia: normal general appearance Urinary system: urethral meatus normal and bladder without fullness, nontender Vaginal: normal without tenderness, induration or  masses Cervix: normal appearance Adnexa: normal bimanual exam Uterus: anteverted and non-tender, normal size   Lab Review Urine pregnancy test Labs reviewed yes Radiologic studies reviewed no    Assessment:    Healthy female exam.    Plan:    Education reviewed: calcium supplements, low fat, low cholesterol diet, self breast exams and weight bearing exercise. Contraception: tubal ligation. Follow up in: 1 year.   Meds ordered this encounter  Medications  . Multiple Vitamins-Minerals (MULTIVITAMIN WITH MINERALS) tablet    Sig: Take 1 tablet by mouth daily.   No orders of the defined types were placed in this encounter.

## 2014-08-16 LAB — GC/CHLAMYDIA PROBE AMP
CT PROBE, AMP APTIMA: NEGATIVE
GC Probe RNA: NEGATIVE

## 2014-08-16 LAB — WET PREP BY MOLECULAR PROBE
Candida species: NEGATIVE
Gardnerella vaginalis: NEGATIVE
Trichomonas vaginosis: NEGATIVE

## 2014-08-16 LAB — PAP IG AND HPV HIGH-RISK: HPV DNA HIGH RISK: NOT DETECTED

## 2014-08-26 ENCOUNTER — Encounter: Payer: Self-pay | Admitting: Obstetrics

## 2014-08-30 ENCOUNTER — Encounter: Payer: Self-pay | Admitting: Obstetrics

## 2014-08-30 ENCOUNTER — Ambulatory Visit (INDEPENDENT_AMBULATORY_CARE_PROVIDER_SITE_OTHER): Payer: Managed Care, Other (non HMO) | Admitting: Obstetrics

## 2014-08-30 VITALS — BP 116/70 | HR 53 | Temp 96.9°F | Ht 63.0 in | Wt 193.0 lb

## 2014-08-30 DIAGNOSIS — Z30430 Encounter for insertion of intrauterine contraceptive device: Secondary | ICD-10-CM

## 2014-08-30 DIAGNOSIS — Z01812 Encounter for preprocedural laboratory examination: Secondary | ICD-10-CM

## 2014-08-30 DIAGNOSIS — Z3202 Encounter for pregnancy test, result negative: Secondary | ICD-10-CM

## 2014-08-30 LAB — POCT URINE PREGNANCY: Preg Test, Ur: NEGATIVE

## 2014-08-30 NOTE — Progress Notes (Signed)
IUD Insertion Procedure Note  Pre-operative Diagnosis: Desire Contraception  Post-operative Diagnosis: same  Indications: contraception  Procedure Details  Urine pregnancy test was done in office and result was negative.  The risks (including infection, bleeding, pain, and uterine perforation) and benefits of the procedure were explained to the patient and Written informed consent was obtained.    Cervix cleansed with Betadine. Uterus sounded to 8 cm. IUD inserted without difficulty. String visible and trimmed. Patient tolerated procedure well.  IUD Information: Mirena, Lot # [id number], Expiration date 04 / 18.  Condition: Stable  Complications: None  Plan:  The patient was advised to call for any fever or for prolonged or severe pain or bleeding. She was advised to use NSAID as needed for mild to moderate pain.   Attending Physician Documentation: I was present for or participated in the entire procedure, including opening and closing. 

## 2014-08-31 LAB — WET PREP BY MOLECULAR PROBE
Candida species: NEGATIVE
GARDNERELLA VAGINALIS: NEGATIVE
Trichomonas vaginosis: NEGATIVE

## 2014-08-31 LAB — GC/CHLAMYDIA PROBE AMP
CT PROBE, AMP APTIMA: NEGATIVE
GC PROBE AMP APTIMA: NEGATIVE

## 2014-09-23 ENCOUNTER — Emergency Department (INDEPENDENT_AMBULATORY_CARE_PROVIDER_SITE_OTHER)
Admission: EM | Admit: 2014-09-23 | Discharge: 2014-09-23 | Disposition: A | Discharge status: Home / Self Care | Payer: Managed Care, Other (non HMO) | Source: Home / Self Care | Attending: Family Medicine | Admitting: Family Medicine

## 2014-09-23 ENCOUNTER — Encounter (HOSPITAL_COMMUNITY): Payer: Self-pay

## 2014-09-23 DIAGNOSIS — J0101 Acute recurrent maxillary sinusitis: Secondary | ICD-10-CM

## 2014-09-23 MED ORDER — CEFDINIR 300 MG PO CAPS: 300.0000 mg | ORAL_CAPSULE | Freq: Two times a day (BID) | ORAL | Status: DC

## 2014-09-23 MED ORDER — IPRATROPIUM BROMIDE 0.06 % NA SOLN: 2.0000 | Freq: Four times a day (QID) | NASAL | Status: DC

## 2014-09-23 MED ORDER — CLINDAMYCIN HCL 300 MG PO CAPS: 300.0000 mg | ORAL_CAPSULE | Freq: Three times a day (TID) | ORAL | Status: DC

## 2014-09-23 NOTE — ED Provider Notes (Signed)
CSN: [id number]     Arrival date & time 09/23/14  1949 History   First MD Initiated Contact with Patient 09/23/14 2002     Chief Complaint  Patient presents with  . Sore Throat   (Consider location/radiation/quality/duration/timing/severity/associated sxs/prior Treatment) Patient is a 33 y.o. female presenting with URI. The history is provided by the patient.  URI Presenting symptoms: congestion and sore throat   Presenting symptoms: no facial pain and no fever   Severity:  Moderate Onset quality:  Gradual Duration:  3 weeks Chronicity:  New Ineffective treatments:  Prescription medications Associated symptoms: neck pain and sinus pain     Past Medical History  Diagnosis Date  . Pre-eclampsia 08/2004    History only - resolved  . GERD (gastroesophageal reflux disease)    Past Surgical History  Procedure Laterality Date  . Cesarean section  08/2004    33wks -pre-eclampsia - [facility]  . Cesarean section with bilateral tubal ligation N/A 03/29/2014    Procedure: CESAREAN SECTION WITH BILATERAL TUBAL LIGATION;  Surgeon: Brock Badharles A Harper, MD;  Location: [facility];  Service: Obstetrics;  Laterality: N/A;   Family History  Problem Relation Age of Onset  . Cancer Father   . Cancer Maternal Grandfather   . Cancer Paternal Grandfather    History  Substance Use Topics  . Smoking status: Never Smoker   . Smokeless tobacco: Never Used  . Alcohol Use: No   OB History    Gravida Para Term Preterm AB TAB SAB Ectopic Multiple Living   2 2 1 1  0 0 0 0 0 2     Review of Systems  Constitutional: Negative.  Negative for fever.  HENT: Positive for congestion and sore throat.   Musculoskeletal: Positive for neck pain.    Allergies  Percocet  Home Medications   Prior to Admission medications   Medication Sig Start Date End Date Taking? Authorizing Provider  clindamycin (CLEOCIN) 300 MG capsule Take 1 capsule (300 mg total) by mouth 3 (three) times daily. 09/23/14   Linna HoffJames D Shimshon Narula, MD   ipratropium (ATROVENT) 0.06 % nasal spray Place 2 sprays into both nostrils 4 (four) times daily. 09/23/14   Linna HoffJames D Sidharth Leverette, MD  Multiple Vitamins-Minerals (MULTIVITAMIN WITH MINERALS) tablet Take 1 tablet by mouth daily.    Historical Provider, MD   BP 140/93 mmHg  Pulse 61  Temp(Src) 99.3 F (37.4 C) (Oral)  Resp 16  SpO2 95%  LMP 08/30/2014 Physical Exam  Constitutional: She is oriented to person, place, and time. She appears well-developed and well-nourished. No distress.  HENT:  Right Ear: External ear normal.  Left Ear: External ear normal.  Mouth/Throat: Oropharynx is clear and moist.  Eyes: Pupils are equal, round, and reactive to light.  Neck: Normal range of motion. Neck supple.  Cardiovascular: Normal heart sounds.   Abdominal: Soft.  Lymphadenopathy:    She has no cervical adenopathy.  Neurological: She is alert and oriented to person, place, and time.  Skin: Skin is warm and dry.  Nursing note and vitals reviewed.   ED Course  Procedures (including critical care time) Labs Review Labs Reviewed - No data to display  Imaging Review No results found.   MDM   1. Acute recurrent maxillary sinusitis        Linna HoffJames D Zelma Mazariego, MD 09/23/14 2028

## 2014-09-23 NOTE — ED Notes (Signed)
MD evaluation only 

## 2014-10-14 ENCOUNTER — Ambulatory Visit: Payer: Managed Care, Other (non HMO) | Admitting: Obstetrics

## 2014-10-21 ENCOUNTER — Encounter: Payer: Self-pay | Admitting: *Deleted

## 2014-10-22 ENCOUNTER — Encounter: Payer: Self-pay | Admitting: Obstetrics & Gynecology

## 2014-11-21 ENCOUNTER — Telehealth: Payer: Self-pay | Admitting: *Deleted

## 2014-11-21 DIAGNOSIS — B373 Candidiasis of vulva and vagina: Secondary | ICD-10-CM

## 2014-11-21 DIAGNOSIS — B3731 Acute candidiasis of vulva and vagina: Secondary | ICD-10-CM

## 2014-11-21 MED ORDER — FLUCONAZOLE 150 MG PO TABS: 150.0000 mg | ORAL_TABLET | Freq: Once | ORAL | Status: DC

## 2014-11-21 NOTE — Telephone Encounter (Signed)
Patient has a yeast infection and is requesting treatment. Patient wants a pill for treatment. OK per Dr Clearance CootsHarper.

## 2014-11-26 ENCOUNTER — Telehealth: Payer: Self-pay | Admitting: *Deleted

## 2014-11-26 DIAGNOSIS — B9689 Other specified bacterial agents as the cause of diseases classified elsewhere: Secondary | ICD-10-CM

## 2014-11-26 DIAGNOSIS — B3731 Acute candidiasis of vulva and vagina: Secondary | ICD-10-CM

## 2014-11-26 DIAGNOSIS — N760 Acute vaginitis: Principal | ICD-10-CM

## 2014-11-26 DIAGNOSIS — B373 Candidiasis of vulva and vagina: Secondary | ICD-10-CM

## 2014-11-26 MED ORDER — TERCONAZOLE 0.4 % VA CREA: 1.0000 | TOPICAL_CREAM | Freq: Every day | VAGINAL | Status: DC

## 2014-11-26 MED ORDER — METRONIDAZOLE 500 MG PO TABS: 500.0000 mg | ORAL_TABLET | Freq: Two times a day (BID) | ORAL | Status: DC

## 2014-11-26 NOTE — Telephone Encounter (Signed)
Patient is calling requesting treatment- she took Diflucan and it didn't clear all her symptom. 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 Patient has burning and slight discharge. Requesting BV and yeast treatment- OK per Dr Clearance CootsHarper, but patient to follow up in the office if her symptoms do not improve with this second treatment.

## 2015-01-09 ENCOUNTER — Other Ambulatory Visit: Payer: Self-pay | Admitting: Obstetrics

## 2015-01-09 ENCOUNTER — Telehealth: Payer: Self-pay | Admitting: *Deleted

## 2015-01-09 NOTE — Telephone Encounter (Signed)
Patient states her IUD can out. She is not concerned because she has had BTL and she was using it for cycle control. Patient wants to talk to Dr Clearance CootsHarper about her cycle control and weight issues.Per Dr Clearance Cootsharper- patient needs appointment- Steward DroneBrenda to schedule.

## 2015-01-10 ENCOUNTER — Encounter: Payer: Self-pay | Admitting: Obstetrics

## 2015-01-10 ENCOUNTER — Ambulatory Visit (INDEPENDENT_AMBULATORY_CARE_PROVIDER_SITE_OTHER): Payer: Managed Care, Other (non HMO) | Admitting: Obstetrics

## 2015-01-10 VITALS — BP 117/79 | HR 63 | Temp 97.1°F | Ht 63.0 in | Wt 197.0 lb

## 2015-01-10 DIAGNOSIS — D509 Iron deficiency anemia, unspecified: Secondary | ICD-10-CM | POA: Diagnosis not present

## 2015-01-10 DIAGNOSIS — N939 Abnormal uterine and vaginal bleeding, unspecified: Secondary | ICD-10-CM

## 2015-01-10 DIAGNOSIS — E669 Obesity, unspecified: Secondary | ICD-10-CM | POA: Diagnosis not present

## 2015-01-10 LAB — CBC
HEMATOCRIT: 39.6 % (ref 36.0–46.0)
HEMOGLOBIN: 12.9 g/dL (ref 12.0–15.0)
MCH: 31 pg (ref 26.0–34.0)
MCHC: 32.6 g/dL (ref 30.0–36.0)
MCV: 95.2 fL (ref 78.0–100.0)
MPV: 10.1 fL (ref 8.6–12.4)
PLATELETS: 266 10*3/uL (ref 150–400)
RBC: 4.16 MIL/uL (ref 3.87–5.11)
RDW: 13.4 % (ref 11.5–15.5)
WBC: 5.2 10*3/uL (ref 4.0–10.5)

## 2015-01-10 MED ORDER — LEVONORGESTREL-ETHINYL ESTRAD 0.15-30 MG-MCG PO TABS: 1.0000 | ORAL_TABLET | Freq: Every day | ORAL | Status: AC

## 2015-01-10 MED ORDER — PHENTERMINE HCL 37.5 MG PO TABS: 37.5000 mg | ORAL_TABLET | Freq: Every day | ORAL | Status: DC

## 2015-01-10 NOTE — Progress Notes (Signed)
Patient ID: Casandra Doffingmanda Saldierna, female   DOB: 22-Sep-1981, 34 y.o.   MRN: [id number]  Chief Complaint  Patient presents with  . Advice Only    Management of Cycles    HPI Casandra Doffingmanda Tabar is a 34 y.o. female.  Having 12 day cycles.  HPI  Past Medical History  Diagnosis Date  . Pre-eclampsia 08/2004    History only - resolved  . GERD (gastroesophageal reflux disease)     Past Surgical History  Procedure Laterality Date  . Cesarean section  08/2004    33wks -pre-eclampsia - [facility]  . Cesarean section with bilateral tubal ligation N/A 03/29/2014    Procedure: CESAREAN SECTION WITH BILATERAL TUBAL LIGATION;  Surgeon: Brock Badharles A Tong Pieczynski, MD;  Location: [facility];  Service: Obstetrics;  Laterality: N/A;    Family History  Problem Relation Age of Onset  . Cancer Father   . Cancer Maternal Grandfather   . Cancer Paternal Grandfather     Social History History  Substance Use Topics  . Smoking status: Never Smoker   . Smokeless tobacco: Never Used  . Alcohol Use: No    Allergies  Allergen Reactions  . Percocet [Oxycodone-Acetaminophen] Nausea And Vomiting    Lots of vomiting; may take vicodin (has had before)    Current Outpatient Prescriptions  Medication Sig Dispense Refill  . levonorgestrel-ethinyl estradiol (NORDETTE) 0.15-30 MG-MCG tablet Take 1 tablet by mouth daily. 1 Package 11  . Multiple Vitamins-Minerals (MULTIVITAMIN WITH MINERALS) tablet Take 1 tablet by mouth daily.    . phentermine (ADIPEX-P) 37.5 MG tablet Take 1 tablet (37.5 mg total) by mouth daily before breakfast. 30 tablet 2   No current facility-administered medications for this visit.    Review of Systems Review of Systems Constitutional: negative for fatigue and weight loss.  Positive for weight gain Respiratory: negative for cough and wheezing Cardiovascular: negative for chest pain, fatigue and palpitations Gastrointestinal: negative for abdominal pain and change in bowel habits Genitourinary:  Positive for  AUB Integument/breast: negative for nipple discharge Musculoskeletal:negative for myalgias Neurological: negative for gait problems and tremors Behavioral/Psych: negative for abusive relationship, depression Endocrine: negative for temperature intolerance     Blood pressure 117/79, pulse 63, temperature 97.1 F (36.2 C), height 5\' 3"  (1.6 m), weight 197 lb (89.359 kg), last menstrual period 12/24/2014, not currently breastfeeding.  Physical Exam Physical Exam:  Deferred  100% of 15 min visit spent on counseling and coordination of care.   Data Reviewed Labs  Assessment     AUB Obesity      Plan    Will try cycle regulation with OCP's. Nordette 28 Rx Phentermine Rx F/U 4 months   Orders Placed This Encounter  Procedures  . Comprehensive metabolic panel  . TSH  . CBC   Meds ordered this encounter  Medications  . levonorgestrel-ethinyl estradiol (NORDETTE) 0.15-30 MG-MCG tablet    Sig: Take 1 tablet by mouth daily.    Dispense:  1 Package    Refill:  11  . phentermine (ADIPEX-P) 37.5 MG tablet    Sig: Take 1 tablet (37.5 mg total) by mouth daily before breakfast.    Dispense:  30 tablet    Refill:  2

## 2015-01-11 LAB — COMPREHENSIVE METABOLIC PANEL
ALK PHOS: 74 U/L (ref 39–117)
ALT: 14 U/L (ref 0–35)
AST: 17 U/L (ref 0–37)
Albumin: 4.4 g/dL (ref 3.5–5.2)
BILIRUBIN TOTAL: 0.4 mg/dL (ref 0.2–1.2)
BUN: 14 mg/dL (ref 6–23)
CO2: 25 mEq/L (ref 19–32)
Calcium: 9.6 mg/dL (ref 8.4–10.5)
Chloride: 103 mEq/L (ref 96–112)
Creat: 0.7 mg/dL (ref 0.50–1.10)
GLUCOSE: 82 mg/dL (ref 70–99)
Potassium: 4.1 mEq/L (ref 3.5–5.3)
Sodium: 137 mEq/L (ref 135–145)
Total Protein: 6.7 g/dL (ref 6.0–8.3)

## 2015-01-11 LAB — TSH: TSH: 1.392 u[IU]/mL (ref 0.350–4.500)

## 2015-03-13 ENCOUNTER — Emergency Department (HOSPITAL_COMMUNITY)
Admission: EM | Admit: 2015-03-13 | Discharge: 2015-03-13 | Disposition: A | Discharge status: Home / Self Care | Payer: Managed Care, Other (non HMO) | Attending: Emergency Medicine | Admitting: Emergency Medicine

## 2015-03-13 ENCOUNTER — Emergency Department (HOSPITAL_COMMUNITY): Payer: Managed Care, Other (non HMO)

## 2015-03-13 ENCOUNTER — Encounter (HOSPITAL_COMMUNITY): Payer: Self-pay | Admitting: *Deleted

## 2015-03-13 DIAGNOSIS — Z79899 Other long term (current) drug therapy: Secondary | ICD-10-CM | POA: Insufficient documentation

## 2015-03-13 DIAGNOSIS — K219 Gastro-esophageal reflux disease without esophagitis: Secondary | ICD-10-CM | POA: Insufficient documentation

## 2015-03-13 DIAGNOSIS — R1011 Right upper quadrant pain: Secondary | ICD-10-CM | POA: Insufficient documentation

## 2015-03-13 DIAGNOSIS — R1013 Epigastric pain: Secondary | ICD-10-CM | POA: Diagnosis not present

## 2015-03-13 DIAGNOSIS — Z793 Long term (current) use of hormonal contraceptives: Secondary | ICD-10-CM | POA: Insufficient documentation

## 2015-03-13 DIAGNOSIS — R079 Chest pain, unspecified: Secondary | ICD-10-CM | POA: Diagnosis present

## 2015-03-13 LAB — CBC WITH DIFFERENTIAL/PLATELET
BASOS ABS: 0 10*3/uL (ref 0.0–0.1)
Basophils Relative: 0 % (ref 0–1)
EOS ABS: 0.2 10*3/uL (ref 0.0–0.7)
Eosinophils Relative: 2 % (ref 0–5)
HCT: 36.7 % (ref 36.0–46.0)
HEMOGLOBIN: 12.6 g/dL (ref 12.0–15.0)
Lymphocytes Relative: 17 % (ref 12–46)
Lymphs Abs: 1.6 10*3/uL (ref 0.7–4.0)
MCH: 31.8 pg (ref 26.0–34.0)
MCHC: 34.3 g/dL (ref 30.0–36.0)
MCV: 92.7 fL (ref 78.0–100.0)
MONOS PCT: 5 % (ref 3–12)
Monocytes Absolute: 0.5 10*3/uL (ref 0.1–1.0)
NEUTROS PCT: 76 % (ref 43–77)
Neutro Abs: 7.2 10*3/uL (ref 1.7–7.7)
PLATELETS: 246 10*3/uL (ref 150–400)
RBC: 3.96 MIL/uL (ref 3.87–5.11)
RDW: 12.9 % (ref 11.5–15.5)
WBC: 9.5 10*3/uL (ref 4.0–10.5)

## 2015-03-13 LAB — URINALYSIS, ROUTINE W REFLEX MICROSCOPIC
Bilirubin Urine: NEGATIVE
Glucose, UA: NEGATIVE mg/dL
HGB URINE DIPSTICK: NEGATIVE
Ketones, ur: NEGATIVE mg/dL
Nitrite: NEGATIVE
Protein, ur: NEGATIVE mg/dL
SPECIFIC GRAVITY, URINE: 1.018 (ref 1.005–1.030)
UROBILINOGEN UA: 0.2 mg/dL (ref 0.0–1.0)
pH: 6 (ref 5.0–8.0)

## 2015-03-13 LAB — COMPREHENSIVE METABOLIC PANEL
ALK PHOS: 60 U/L (ref 38–126)
ALT: 17 U/L (ref 14–54)
ANION GAP: 8 (ref 5–15)
AST: 17 U/L (ref 15–41)
Albumin: 3.3 g/dL — ABNORMAL LOW (ref 3.5–5.0)
BILIRUBIN TOTAL: 0.4 mg/dL (ref 0.3–1.2)
BUN: 8 mg/dL (ref 6–20)
CHLORIDE: 107 mmol/L (ref 101–111)
CO2: 23 mmol/L (ref 22–32)
Calcium: 9 mg/dL (ref 8.9–10.3)
Creatinine, Ser: 0.84 mg/dL (ref 0.44–1.00)
GFR calc Af Amer: >60 mL/min (ref >60)
GFR calc non Af Amer: >60 mL/min (ref >60)
GLUCOSE: 100 mg/dL — AB (ref 65–99)
POTASSIUM: 3.8 mmol/L (ref 3.5–5.1)
Sodium: 138 mmol/L (ref 135–145)
Total Protein: 6.2 g/dL — ABNORMAL LOW (ref 6.5–8.1)

## 2015-03-13 LAB — URINE MICROSCOPIC-ADD ON

## 2015-03-13 LAB — I-STAT TROPONIN, ED: TROPONIN I, POC: 0 ng/mL (ref 0.00–0.08)

## 2015-03-13 LAB — LIPASE, BLOOD: LIPASE: 25 U/L (ref 22–51)

## 2015-03-13 MED ORDER — FENTANYL CITRATE (PF) 100 MCG/2ML IJ SOLN
50.0000 ug | Freq: Once | INTRAMUSCULAR | Status: AC
  Administered 2015-03-13: 50 ug via INTRAVENOUS
  Filled 2015-03-13: qty 2

## 2015-03-13 MED ORDER — HYDROCODONE-ACETAMINOPHEN 7.5-325 MG/15ML PO SOLN
15.0000 mL | Freq: Four times a day (QID) | ORAL | Status: AC | PRN
Start: 2015-03-13 — End: 2016-03-12

## 2015-03-13 MED ORDER — GI COCKTAIL ~~LOC~~
30.0000 mL | Freq: Once | ORAL | Status: AC
  Administered 2015-03-13: 30 mL via ORAL
  Filled 2015-03-13: qty 30

## 2015-03-13 MED ORDER — SODIUM CHLORIDE 0.9 % IV BOLUS (SEPSIS)
1000.0000 mL | Freq: Once | INTRAVENOUS | Status: AC
  Administered 2015-03-13: 1000 mL via INTRAVENOUS

## 2015-03-13 MED ORDER — ONDANSETRON 4 MG PO TBDP: 4.0000 mg | ORAL_TABLET | Freq: Three times a day (TID) | ORAL | Status: DC | PRN

## 2015-03-13 MED ORDER — ONDANSETRON HCL 4 MG/2ML IJ SOLN
4.0000 mg | Freq: Once | INTRAMUSCULAR | Status: AC
  Administered 2015-03-13: 4 mg via INTRAVENOUS
  Filled 2015-03-13: qty 2

## 2015-03-13 MED ORDER — SUCRALFATE 1 GM/10ML PO SUSP: 1.0000 g | Freq: Three times a day (TID) | ORAL | Status: DC

## 2015-03-13 MED ORDER — PANTOPRAZOLE SODIUM 40 MG IV SOLR
40.0000 mg | Freq: Once | INTRAVENOUS | Status: AC
  Administered 2015-03-13: 40 mg via INTRAVENOUS
  Filled 2015-03-13: qty 40

## 2015-03-13 NOTE — ED Provider Notes (Signed)
CSN: [id number]     Arrival date & time 03/13/15  [id number] History   First MD Initiated Contact with Patient 03/13/15 [phone]     Chief Complaint  Patient presents with  . Chest Pain     (Consider location/radiation/quality/duration/timing/severity/associated sxs/prior Treatment) HPI   PCP: No PCP Per Patient Blood pressure 113/71, pulse 67, temperature 98.3 F (36.8 C), temperature source Oral, resp. rate 18, height 5\' 3"  (1.6 m), weight 197 lb (89.359 kg), last menstrual period 12/30/2014, SpO2 100 %, not currently breastfeeding.  Tina [name] is a 34 y.o.female without any significant PMH presents to the ER with complaints of epigastric pain and tightness to her upper abdomen. She was seen at the Urgent Care for the same and referred to Gastroenterology and Cardiology.  Since Tuesday she has had epigastric pain, burping and pain with eating. The pain is worse when she coughs or swallows anything. She feels as though food is "restricted" although it does go down and she has not had any vomiting. She has a hx of acid reflux but has not had any problems in a few years. The urgent care gave her an Rx for Prilosec but she does not feel like that is helping with the pain she has when she eats. She has not had any abdominal pains, SOB, CP, diaphoresis, diarrhea, lower extremity swelling, orthopnea, weight loss, palpitations, headaches or changes in vision.   Past Medical History  Diagnosis Date  . Pre-eclampsia 08/2004    History only - resolved  . GERD (gastroesophageal reflux disease)    Past Surgical History  Procedure Laterality Date  . Cesarean section  08/2004    33wks -pre-eclampsia - [facility]  . Cesarean section with bilateral tubal ligation N/A 03/29/2014    Procedure: CESAREAN SECTION WITH BILATERAL TUBAL LIGATION;  Surgeon: Brock Badharles A Harper, MD;  Location: [facility];  Service: Obstetrics;  Laterality: N/A;   Family History  Problem Relation Age of Onset  . Cancer Father   . Cancer Maternal  Grandfather   . Cancer Paternal Grandfather    History  Substance Use Topics  . Smoking status: Never Smoker   . Smokeless tobacco: Never Used  . Alcohol Use: No   OB History    Gravida Para Term Preterm AB TAB SAB Ectopic Multiple Living   2 2 1 1  0 0 0 0 0 2     Review of Systems  10 Systems reviewed and are negative for acute change except as noted in the HPI.   Allergies  Percocet  Home Medications   Prior to Admission medications   Medication Sig Start Date End Date Taking? Authorizing Provider  levonorgestrel-ethinyl estradiol (NORDETTE) 0.15-30 MG-MCG tablet Take 1 tablet by mouth daily. 01/10/15  Yes Brock Badharles A Harper, MD  metoCLOPramide (REGLAN) 10 MG tablet Take 10 mg by mouth 4 (four) times daily.   Yes Historical Provider, MD  Multiple Vitamins-Minerals (MULTIVITAMIN WITH MINERALS) tablet Take 1 tablet by mouth daily.   Yes Historical Provider, MD  omeprazole (PRILOSEC) 20 MG capsule Take 20 mg by mouth 2 (two) times daily. 03/12/15  Yes Historical Provider, MD  HYDROcodone-acetaminophen (HYCET) 7.5-325 mg/15 ml solution Take 15 mLs by mouth 4 (four) times daily as needed for moderate pain. 03/13/15 03/12/16  Jalayne Ganesh Neva SeatGreene, PA-C  ondansetron (ZOFRAN ODT) 4 MG disintegrating tablet Take 1 tablet (4 mg total) by mouth every 8 (eight) hours as needed for nausea or vomiting. 03/13/15   Marlon Peliffany Trista Ciocca, PA-C  phentermine (ADIPEX-P) 37.5 MG tablet  Take 1 tablet (37.5 mg total) by mouth daily before breakfast. Patient not taking: Reported on 03/13/2015 01/10/15   Brock Badharles A Harper, MD  sucralfate (CARAFATE) 1 GM/10ML suspension Take 10 mLs (1 g total) by mouth 4 (four) times daily -  with meals and at bedtime. 03/13/15   Leeanna Slaby Neva SeatGreene, PA-C   BP 123/55 mmHg  Pulse 67  Temp(Src) 98.3 F (36.8 C) (Oral)  Resp 12  Ht 5\' 3"  (1.6 m)  Wt 197 lb (89.359 kg)  BMI 34.91 kg/m2  SpO2 100%  LMP 12/30/2014   Physical Exam  Constitutional: She appears well-developed and well-nourished.  No distress.  HENT:  Head: Normocephalic and atraumatic.  Eyes: Pupils are equal, round, and reactive to light.  Neck: Normal range of motion. Neck supple.  Cardiovascular: Normal rate and regular rhythm.   Pulmonary/Chest: Effort normal and breath sounds normal. She has no decreased breath sounds. She has no wheezes.  Abdominal: Soft. Bowel sounds are normal. She exhibits no distension and no fluid wave. There is tenderness (mild) in the right upper quadrant and epigastric area. There is no rigidity, no rebound, no guarding and no CVA tenderness.    Neurological: She is alert.  Skin: Skin is warm and dry.  Nursing note and vitals reviewed.  ED Course  Procedures (including critical care time) Labs Review Labs Reviewed  COMPREHENSIVE METABOLIC PANEL - Abnormal; Notable for the following:    Glucose, Bld 100 (*)    Total Protein 6.2 (*)    Albumin 3.3 (*)    All other components within normal limits  URINE CULTURE  CBC WITH DIFFERENTIAL/PLATELET  LIPASE, BLOOD  URINALYSIS, ROUTINE W REFLEX MICROSCOPIC  I-STAT TROPOININ, ED    Imaging Review Dg Chest 2 View  03/13/2015   CLINICAL DATA:  Epigastric pain for 3 days.  Initial encounter.  EXAM: CHEST  2 VIEW  COMPARISON:  09/19/2004.  FINDINGS: Cardiopericardial silhouette within normal limits. Mediastinal contours normal. Trachea midline. No airspace disease or effusion. Monitoring leads project over the chest.  IMPRESSION: No active cardiopulmonary disease.   Electronically Signed   By: Andreas NewportGeoffrey  Lamke M.D.   On: 03/13/2015 11:11     EKG Interpretation None     Tina [name], [place] [id number] 13-Mar-2015 09:53:38 [facility]/ED ROUTINE RECORD Normal sinus rhythm Normal ECG 6925mm/s 2610mm/mV 100Hz  8.0 SP2 12SL 241 HD CID: [id number] Referred by: Unconfirmed Vent. rate 60 BPM PR interval 138 ms QRS duration 80 ms QT/QTc 382/382 ms P-R-T axes 45 48 55  MDM   Final diagnoses:  Epigastric pain    The patients chest xray,  EKG and Troponin are unremarkable.  She has not had any CP or SOB, she is not hypoxic, tachycardic and has no LE swelling, PE is very unlikely.  Her symptoms are mainly with eating and food, d/dx include hiatal hernia, esophagitis, peptic ulcer, esophageal stricture.  Her lab work CBC, CMP and Lipase are also unremarkable. No further imaging indicated at this time from the ER standpoint. She will however benefit from a GI consult. She will likely need an endoscopy or barium swallow for further eval.  In the ED she was given: Medications  sodium chloride 0.9 % bolus 1,000 mL (1,000 mLs Intravenous New Bag/Given 03/13/15 1106)  pantoprazole (PROTONIX) injection 40 mg (40 mg Intravenous Given 03/13/15 1106)  ondansetron (ZOFRAN) injection 4 mg (4 mg Intravenous Given 03/13/15 1107)  fentaNYL (SUBLIMAZE) injection 50 mcg (50 mcg Intravenous Given 03/13/15 1107)  gi cocktail (Maalox,Lidocaine,Donnatal) (30 mLs  Oral Given 03/13/15 1136)    She had complete relief of her pain. Will rx Carafate, cont. Prilosec, zofran and Vicodin. Referral to GI given.  At end of shift, urinalysis pending. Tatyana, K. PA-C will treat accordingly and has assumed care of the patient at this time.  Ceasar Mons Vitals:   03/13/15 1130  BP: 123/55  Pulse: 67  Temp:   Resp: [address], PA-C 03/13/15 1222  Toy Cookey, MD 03/13/15 2052

## 2015-03-13 NOTE — ED Notes (Signed)
Pt arrives from home via POV. Pt states she has had cp since Tuesday that continues to worsen. Pt states it hurts worse when she swallows or coughs. Pt states she went to UC yesterday and they referred her to a cardiologist and a gastroenterologist. Pt states she couldn't get seen in a timely fashion and states she can't deal with the pain.

## 2015-03-13 NOTE — Discharge Instructions (Signed)
Food Choices for Peptic Ulcer Disease When you have peptic ulcer disease, the foods you eat and your eating habits are very important. Choosing the right foods can help ease the discomfort of peptic ulcer disease. WHAT GENERAL GUIDELINES DO I NEED TO FOLLOW?  Choose fruits, vegetables, whole grains, and low-fat meat, fish, and poultry.   Keep a food diary to identify foods that cause symptoms.  Avoid foods that cause irritation or pain. These may be different for different people.  Eat frequent small meals instead of three large meals each day. The pain may be worse when your stomach is empty.  Avoid eating close to bedtime. WHAT FOODS ARE NOT RECOMMENDED? The following are some foods and drinks that may worsen your symptoms:  Black, white, and red pepper.  Hot sauce.  Chili peppers.  Chili powder.  Chocolate and cocoa.   Alcohol.  Tea, coffee, and cola (regular and decaffeinated). The items listed above may not be a complete list of foods and beverages to avoid. Contact your dietitian for more information. Document Released: 01/03/2012 Document Revised: 10/16/2013 Document Reviewed: 08/15/2013 Exit[employer] Patient Information 2015 [employer]. This information is not intended to replace advice given to you by your health care provider. Make sure you discuss any questions you have with your health care provider. Peptic Ulcer A peptic ulcer is a sore in the lining of your esophagus (esophageal ulcer), stomach (gastric ulcer), or in the first part of your small intestine (duodenal ulcer). The ulcer causes erosion into the deeper tissue. CAUSES  Normally, the lining of the stomach and the small intestine protects itself from the acid that digests food. The protective lining can be damaged by:  An infection caused by a bacterium called Helicobacter pylori (H. pylori).  Regular use of nonsteroidal anti-inflammatory drugs (NSAIDs), such as ibuprofen or aspirin.  Smoking  tobacco. Other risk factors include being older than 50, drinking alcohol excessively, and having a family history of ulcer disease.  SYMPTOMS   Burning pain or gnawing in the area between the chest and the belly button.  Heartburn.  Nausea and vomiting.  Bloating. The pain can be worse on an empty stomach and at night. If the ulcer results in bleeding, it can cause:  Black, tarry stools.  Vomiting of bright red blood.  Vomiting of coffee-ground-looking materials. DIAGNOSIS  A diagnosis is usually made based upon your history and an exam. Other tests and procedures may be performed to find the cause of the ulcer. Finding a cause will help determine the best treatment. Tests and procedures may include:  Blood tests, stool tests, or breath tests to check for the bacterium H. pylori.  An upper gastrointestinal (GI) series of the esophagus, stomach, and small intestine.  An endoscopy to examine the esophagus, stomach, and small intestine.  A biopsy. TREATMENT  Treatment may include:  Eliminating the cause of the ulcer, such as smoking, NSAIDs, or alcohol.  Medicines to reduce the amount of acid in your digestive tract.  Antibiotic medicines if the ulcer is caused by the H. pylori bacterium.  An upper endoscopy to treat a bleeding ulcer.  Surgery if the bleeding is severe or if the ulcer created a hole somewhere in the digestive system. HOME CARE INSTRUCTIONS   Avoid tobacco, alcohol, and caffeine. Smoking can increase the acid in the stomach, and continued smoking will impair the healing of ulcers.  Avoid foods and drinks that seem to cause discomfort or aggravate your ulcer.  Only take medicines as   directed by your caregiver. Do not substitute over-the-counter medicines for prescription medicines without talking to your caregiver.  Keep any follow-up appointments and tests as directed. SEEK MEDICAL CARE IF:   Your do not improve within 7 days of starting  treatment.  You have ongoing indigestion or heartburn. SEEK IMMEDIATE MEDICAL CARE IF:   You have sudden, sharp, or persistent abdominal pain.  You have bloody or dark black, tarry stools.  You vomit blood or vomit that looks like coffee grounds.  You become light-headed, weak, or feel faint.  You become sweaty or clammy. MAKE SURE YOU:   Understand these instructions.  Will watch your condition.  Will get help right away if you are not doing well or get worse. Document Released: 10/08/2000 Document Revised: 02/25/2014 Document Reviewed: 05/10/2012 ExitCare Patient Information 2015 [employer]. This information is not intended to replace advice given to you by your health care provider. Make sure you discuss any questions you have with your health care provider.  

## 2015-03-13 NOTE — ED Notes (Signed)
Pt is in stable condition upon d/c and ambulates from ED. 

## 2015-03-14 LAB — URINE CULTURE
Colony Count: 9000
SPECIAL REQUESTS: NORMAL

## 2015-05-13 ENCOUNTER — Ambulatory Visit: Payer: Managed Care, Other (non HMO) | Admitting: Obstetrics

## 2015-05-13 ENCOUNTER — Encounter: Payer: Self-pay | Admitting: Obstetrics

## 2015-08-20 ENCOUNTER — Ambulatory Visit: Payer: Managed Care, Other (non HMO) | Admitting: Obstetrics

## 2017-01-24 ENCOUNTER — Ambulatory Visit (INDEPENDENT_AMBULATORY_CARE_PROVIDER_SITE_OTHER): Payer: Managed Care, Other (non HMO)

## 2017-01-24 ENCOUNTER — Ambulatory Visit (INDEPENDENT_AMBULATORY_CARE_PROVIDER_SITE_OTHER): Payer: Managed Care, Other (non HMO) | Admitting: Emergency Medicine

## 2017-01-24 VITALS — BP 126/83 | HR 55 | Temp 98.2°F | Resp 16 | Ht 63.0 in | Wt 170.8 lb

## 2017-01-24 DIAGNOSIS — G44319 Acute post-traumatic headache, not intractable: Secondary | ICD-10-CM

## 2017-01-24 DIAGNOSIS — S161XXA Strain of muscle, fascia and tendon at neck level, initial encounter: Secondary | ICD-10-CM

## 2017-01-24 DIAGNOSIS — M542 Cervicalgia: Secondary | ICD-10-CM | POA: Diagnosis not present

## 2017-01-24 MED ORDER — CYCLOBENZAPRINE HCL 10 MG PO TABS: 10.0000 mg | ORAL_TABLET | Freq: Three times a day (TID) | ORAL | 0 refills | Status: DC | PRN

## 2017-01-24 MED ORDER — DICLOFENAC SODIUM 75 MG PO TBEC: 75.0000 mg | DELAYED_RELEASE_TABLET | Freq: Two times a day (BID) | ORAL | 0 refills | Status: DC
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
## 2017-01-24 NOTE — Patient Instructions (Addendum)
   IF you received an x-ray today, you will receive an invoice from [facility]. Please contact [facility] at [phone] with questions or concerns regarding your invoice.   IF you received labwork today, you will receive an invoice from [facility]. Please contact [facility] at [phone] with questions or concerns regarding your invoice.   Our billing staff will not be able to assist you with questions regarding bills from these companies.  You will be contacted with the lab results as soon as they are available. The fastest way to get your results is to activate your My Chart account. Instructions are located on the last page of this paperwork. If you have not heard from us regarding the results in 2 weeks, please contact this office.      Cervical Sprain A cervical sprain is a stretch or tear in the tissues that connect bones (ligaments) in the neck. Most neck (cervical) sprains get better in 4-6 weeks. Follow these instructions at home: If you have a neck collar:  Wear it as told by your doctor. Do not take off (do not remove) the collar unless your doctor says that this is safe.  Ask your doctor before adjusting your collar.  If you have long hair, keep it outside of the collar.  Ask your doctor if you may take off the collar for cleaning and bathing. If you may take off the collar: ? Follow instructions from your doctor about how to take off the collar safely. ? Clean the collar by wiping it with mild soap and water. Let it air-dry all the way. ? If your collar has removable pads:  Take the pads out every 1-2 days.  Hand wash the pads with soap and water.  Let the pads air-dry all the way before you put them back in the collar. Do not dry them in a clothes dryer. Do not dry them with a hair dryer. ? Check your skin under the collar for irritation or sores. If you see any, tell your doctor. Managing pain, stiffness, and swelling  Use a cervical traction  device, if told by your doctor.  If told, put heat on the affected area. Do this before exercises (physical therapy) or as often as told by your doctor. Use the heat source that your doctor recommends, such as a moist heat pack or a heating pad. ? Place a towel between your skin and the heat source. ? Leave the heat on for 20-30 minutes. ? Take the heat off (remove the heat) if your skin turns bright red. This is very important if you cannot feel pain, heat, or cold. You may have a greater risk of getting burned.  Put ice on the affected area. ? Put ice in a plastic bag. ? Place a towel between your skin and the bag. ? Leave the ice on for 20 minutes, 2-3 times a day. Activity  Do not drive while wearing a neck collar. If you do not have a neck collar, ask your doctor if it is safe to drive.  Do not drive or use heavy machinery while taking prescription pain medicine or muscle relaxants, unless your doctor approves.  Do not lift anything that is heavier than 10 lb (4.5 kg) until your doctor tells you that it is safe.  Rest as told by your doctor.  Avoid activities that make you feel worse. Ask your doctor what activities are safe for you.  Do exercises as told by your doctor or physical   therapist. Preventing neck sprain  Practice good posture. Adjust your workstation to help with this, if needed.  Exercise regularly as told by your doctor or physical therapist.  Avoid activities that are risky or may cause a neck sprain (cervical sprain). General instructions  Take over-the-counter and prescription medicines only as told by your doctor.  Do not use any products that contain nicotine or tobacco. This includes cigarettes and e-cigarettes. If you need help quitting, ask your doctor.  Keep all follow-up visits as told by your doctor. This is important. Contact a doctor if:  You have pain or other symptoms that get worse.  You have symptoms that do not get better after 2  weeks.  You have pain that does not get better with medicine.  You start to have new, unexplained symptoms.  You have sores or irritated skin from wearing your neck collar. Get help right away if:  You have very bad pain.  You have any of the following in any part of your body: ? Loss of feeling (numbness). ? Tingling. ? Weakness.  You cannot move a part of your body (you have paralysis).  Your activity level does not improve. Summary  A cervical sprain is a stretch or tear in the tissues that connect bones (ligaments) in the neck.  If you have a neck (cervical) collar, do not take off the collar unless your doctor says that this is safe.  Put ice on affected areas as told by your doctor.  Put heat on affected areas as told by your doctor.  Good posture and regular exercise can help prevent a neck sprain from happening again. This information is not intended to replace advice given to you by your health care provider. Make sure you discuss any questions you have with your health care provider. Document Released: 03/29/2008 Document Revised: 06/22/2016 Document Reviewed: 06/22/2016 Elsevier Interactive Patient Education  2017 Elsevier Inc.  

## 2017-01-26 ENCOUNTER — Telehealth: Payer: Self-pay | Admitting: General Practice

## 2017-01-26 ENCOUNTER — Other Ambulatory Visit: Payer: Self-pay | Admitting: Emergency Medicine

## 2017-01-26 DIAGNOSIS — S161XXA Strain of muscle, fascia and tendon at neck level, initial encounter: Secondary | ICD-10-CM

## 2017-01-26 MED ORDER — PREDNISONE 20 MG PO TABS: 40.0000 mg | ORAL_TABLET | Freq: Every day | ORAL | 0 refills | Status: AC

## 2017-01-26 NOTE — Telephone Encounter (Signed)
Pt is still having back pain and needs to know what to do next and the meds she was given can not be taken at work  [employer] number [phone]

## 2017-01-26 NOTE — Telephone Encounter (Signed)
May benefit from both, Rehab and change of meds. Will start Prednisone. Will request PT.

## 2017-01-26 NOTE — Telephone Encounter (Signed)
Pt is complaining of continuous back pain with movement States, the Flexeril is causing drowsiness and unable to take while at work Requesting Rehab possibly or different pain regimen Please advise

## 2017-01-31 ENCOUNTER — Encounter: Payer: Self-pay | Admitting: Physical Therapy

## 2017-01-31 ENCOUNTER — Ambulatory Visit: Payer: No Typology Code available for payment source | Attending: Emergency Medicine | Admitting: Physical Therapy

## 2017-01-31 DIAGNOSIS — M542 Cervicalgia: Secondary | ICD-10-CM | POA: Insufficient documentation

## 2017-01-31 DIAGNOSIS — R293 Abnormal posture: Secondary | ICD-10-CM

## 2017-01-31 DIAGNOSIS — M545 Low back pain, unspecified: Secondary | ICD-10-CM

## 2017-01-31 DIAGNOSIS — M62838 Other muscle spasm: Secondary | ICD-10-CM | POA: Diagnosis present

## 2017-02-01 ENCOUNTER — Encounter: Payer: Self-pay | Admitting: Physical Therapy
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
## 2017-02-04 ENCOUNTER — Ambulatory Visit: Payer: No Typology Code available for payment source | Admitting: Physical Therapy

## 2017-02-10 ENCOUNTER — Other Ambulatory Visit: Payer: Self-pay | Admitting: Emergency Medicine

## 2017-02-11 NOTE — Telephone Encounter (Signed)
01/24/17 last ov and refill

## 2017-10-13 ENCOUNTER — Other Ambulatory Visit: Payer: Self-pay | Admitting: Gastroenterology

## 2017-10-13 DIAGNOSIS — R1011 Right upper quadrant pain: Secondary | ICD-10-CM

## 2019-07-30 ENCOUNTER — Other Ambulatory Visit: Payer: Self-pay

## 2019-07-30 DIAGNOSIS — Z20822 Contact with and (suspected) exposure to covid-19: Secondary | ICD-10-CM

## 2019-07-31 LAB — NOVEL CORONAVIRUS, NAA: SARS-CoV-2, NAA: DETECTED — AB

## 2019-08-01 ENCOUNTER — Telehealth: Payer: Self-pay | Admitting: *Deleted

## 2019-08-01 NOTE — Telephone Encounter (Signed)
Will notify local health department of positive [phone].

## 2019-08-04 ENCOUNTER — Encounter (HOSPITAL_COMMUNITY): Payer: Self-pay | Admitting: Emergency Medicine

## 2019-08-04 ENCOUNTER — Emergency Department (HOSPITAL_COMMUNITY)
Admission: EM | Admit: 2019-08-04 | Discharge: 2019-08-04 | Disposition: A | Discharge status: Home / Self Care | Payer: 59 | Attending: Emergency Medicine | Admitting: Emergency Medicine

## 2019-08-04 ENCOUNTER — Other Ambulatory Visit: Payer: Self-pay

## 2019-08-04 ENCOUNTER — Emergency Department (HOSPITAL_COMMUNITY): Payer: 59

## 2019-08-04 DIAGNOSIS — L509 Urticaria, unspecified: Secondary | ICD-10-CM | POA: Diagnosis not present

## 2019-08-04 DIAGNOSIS — U071 COVID-19: Secondary | ICD-10-CM | POA: Diagnosis not present

## 2019-08-04 DIAGNOSIS — Z79899 Other long term (current) drug therapy: Secondary | ICD-10-CM | POA: Diagnosis not present

## 2019-08-04 DIAGNOSIS — R21 Rash and other nonspecific skin eruption: Secondary | ICD-10-CM | POA: Diagnosis present

## 2019-08-04 MED ORDER — FAMOTIDINE 20 MG PO TABS: 20.0000 mg | ORAL_TABLET | Freq: Two times a day (BID) | ORAL | 0 refills | Status: AC

## 2019-08-04 MED ORDER — FAMOTIDINE 20 MG PO TABS
20.0000 mg | ORAL_TABLET | Freq: Once | ORAL | Status: AC
  Administered 2019-08-04: 20 mg via ORAL
  Filled 2019-08-04: qty 1

## 2019-08-04 MED ORDER — DEXAMETHASONE SODIUM PHOSPHATE 10 MG/ML IJ SOLN
10.0000 mg | Freq: Once | INTRAMUSCULAR | Status: AC
  Administered 2019-08-04: 10 mg via INTRAMUSCULAR
  Filled 2019-08-04: qty 1

## 2019-08-04 MED ORDER — DIPHENHYDRAMINE HCL 50 MG/ML IJ SOLN
50.0000 mg | Freq: Once | INTRAMUSCULAR | Status: AC
  Administered 2019-08-04: 50 mg via INTRAMUSCULAR
  Filled 2019-08-04: qty 1

## 2019-08-04 MED ORDER — DIPHENHYDRAMINE HCL 25 MG PO TABS: 50.0000 mg | ORAL_TABLET | Freq: Four times a day (QID) | ORAL | 0 refills | Status: AC | PRN

## 2019-08-04 MED ORDER — PREDNISONE 20 MG PO TABS: ORAL_TABLET | ORAL | 0 refills | Status: DC

## 2019-08-04 NOTE — ED Triage Notes (Signed)
Pt. Stated, I tested positive for COVID last Saturday and my whole family. Last night I started having a rash all over and its driving me crazy. I just can't take it. Also my chest feels like its real tight.
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
## 2019-08-04 NOTE — ED Notes (Signed)
Patient verbalizes understanding of discharge instructions. Opportunity for questioning and answers were provided. Armband removed by staff, pt discharged from ED.  

## 2019-08-04 NOTE — Discharge Instructions (Addendum)
1.  Start taking prednisone as prescribed day after tomorrow.  You have been given a shot of Decadron in the emergency department.  This will be working over the next 24 to 48 hours.  Start taking Pepcid twice daily as prescribed.  Take Benadryl 25 to 50 mg every 6 hours as needed for itching and rash. 2.  Quarantine yourself per COVID instructions. 3.  Return to the emergency department if you develop difficulty breathing, severe general weakness, confusion or other concerning symptoms.

## 2019-08-06 ENCOUNTER — Encounter (HOSPITAL_COMMUNITY): Payer: Self-pay

## 2019-08-06 ENCOUNTER — Other Ambulatory Visit: Payer: Self-pay

## 2019-08-06 ENCOUNTER — Ambulatory Visit (HOSPITAL_COMMUNITY)
Admission: EM | Admit: 2019-08-06 | Discharge: 2019-08-06 | Disposition: A | Discharge status: Home / Self Care | Payer: 59

## 2019-08-06 DIAGNOSIS — L509 Urticaria, unspecified: Secondary | ICD-10-CM | POA: Diagnosis not present

## 2019-08-06 DIAGNOSIS — R21 Rash and other nonspecific skin eruption: Secondary | ICD-10-CM

## 2019-08-06 MED ORDER — METHYLPREDNISOLONE SODIUM SUCC 125 MG IJ SOLR
INTRAMUSCULAR | Status: AC
  Filled 2019-08-06: qty 2

## 2019-08-06 MED ORDER — PREDNISONE 10 MG PO TABS: ORAL_TABLET | ORAL | 0 refills | Status: AC

## 2019-08-06 MED ORDER — METHYLPREDNISOLONE SODIUM SUCC 125 MG IJ SOLR
125.0000 mg | Freq: Once | INTRAMUSCULAR | Status: AC
  Administered 2019-08-06: 10:00:00 125 mg via INTRAMUSCULAR

## 2019-08-06 MED ORDER — CETIRIZINE HCL 10 MG PO CAPS: 10.0000 mg | ORAL_CAPSULE | Freq: Every day | ORAL | 0 refills | Status: AC

## 2019-08-06 NOTE — ED Triage Notes (Signed)
Pt presents to UC stating she went to ER for hives/rash on 10/10. Pt states rash and itching went away in ER but both came back yesterday. Pt states she took the predisone 1 day early because rash came back and she "wanted to take something". Pt states rash is all over arms, legs, and back.
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
## 2019-08-06 NOTE — Discharge Instructions (Signed)
We gave you a shot of Solu-Medrol today Please continue with prednisone starting in the morning, please increase to 60 mg / 6 tablets for the first 2 days (Tuesday, Wednesday) 5 tablets on day 3/Thursday, 4 on day 4/Friday, 30 mg / 3 tablets on day 5/Saturday, 2 tablets on Sunday, 1 tablet on Monday.  Please also continue to use antihistamines to help with your itching Try taking cetirizine/Zyrtec in the morning, supplement with Benadryl in the evening/as needed Continue Pepcid twice daily  Please continue to monitor your rash and symptoms if developing any airway involvement, mouth swelling please follow-up in emergency room, please follow-up if rash continuing despite use of the above.

## 2019-08-10 ENCOUNTER — Other Ambulatory Visit: Payer: Self-pay

## 2019-08-10 DIAGNOSIS — Z20822 Contact with and (suspected) exposure to covid-19: Secondary | ICD-10-CM

## 2019-08-12 LAB — NOVEL CORONAVIRUS, NAA: SARS-CoV-2, NAA: NOT DETECTED

## 2019-12-18 DIAGNOSIS — Z6837 Body mass index (BMI) 37.0-37.9, adult: Secondary | ICD-10-CM | POA: Diagnosis not present

## 2019-12-18 DIAGNOSIS — Z01419 Encounter for gynecological examination (general) (routine) without abnormal findings: Secondary | ICD-10-CM | POA: Diagnosis not present

## 2019-12-18 DIAGNOSIS — E669 Obesity, unspecified: Secondary | ICD-10-CM | POA: Diagnosis not present

## 2019-12-18 DIAGNOSIS — N920 Excessive and frequent menstruation with regular cycle: Secondary | ICD-10-CM | POA: Diagnosis not present

## 2019-12-18 DIAGNOSIS — F419 Anxiety disorder, unspecified: Secondary | ICD-10-CM | POA: Diagnosis not present

## 2019-12-26 DIAGNOSIS — Z0000 Encounter for general adult medical examination without abnormal findings: Secondary | ICD-10-CM | POA: Diagnosis not present

## 2019-12-26 DIAGNOSIS — Z131 Encounter for screening for diabetes mellitus: Secondary | ICD-10-CM | POA: Diagnosis not present

## 2019-12-26 DIAGNOSIS — Z13220 Encounter for screening for lipoid disorders: Secondary | ICD-10-CM | POA: Diagnosis not present

## 2019-12-26 DIAGNOSIS — Z1329 Encounter for screening for other suspected endocrine disorder: Secondary | ICD-10-CM | POA: Diagnosis not present

## 2020-01-15 DIAGNOSIS — R519 Headache, unspecified: Secondary | ICD-10-CM | POA: Diagnosis not present

## 2020-01-15 DIAGNOSIS — F419 Anxiety disorder, unspecified: Secondary | ICD-10-CM | POA: Diagnosis not present

## 2020-01-17 ENCOUNTER — Ambulatory Visit: Payer: Self-pay | Admitting: Neurology

## 2021-09-09 ENCOUNTER — Other Ambulatory Visit: Payer: Self-pay | Admitting: Gastroenterology

## 2021-09-09 DIAGNOSIS — R7989 Other specified abnormal findings of blood chemistry: Secondary | ICD-10-CM

## 2021-09-25 ENCOUNTER — Other Ambulatory Visit: Payer: 59

## 2021-10-05 ENCOUNTER — Encounter: Payer: Self-pay | Admitting: *Deleted

## 2021-10-07 ENCOUNTER — Ambulatory Visit: Payer: Self-pay | Admitting: Psychiatry

## 2021-10-21 ENCOUNTER — Ambulatory Visit
Admission: RE | Admit: 2021-10-21 | Discharge: 2021-10-21 | Disposition: A | Discharge status: Home / Self Care | Payer: 59 | Source: Ambulatory Visit | Attending: Gastroenterology | Admitting: Gastroenterology

## 2021-10-21 DIAGNOSIS — R7989 Other specified abnormal findings of blood chemistry: Secondary | ICD-10-CM

## 2022-03-15 IMAGING — US US ABDOMEN LIMITED
1 series · 14 of 25 positions shown · non-contrast
Comparison: Chest XR, 08/04/2019.

CLINICAL DATA: Elevated LFTs.

EXAM:
ULTRASOUND ABDOMEN LIMITED RIGHT UPPER QUADRANT

[Series 1: us abdomen limited · 0.25mm/px · 14 of 31 slices shown]
[im 1/31]
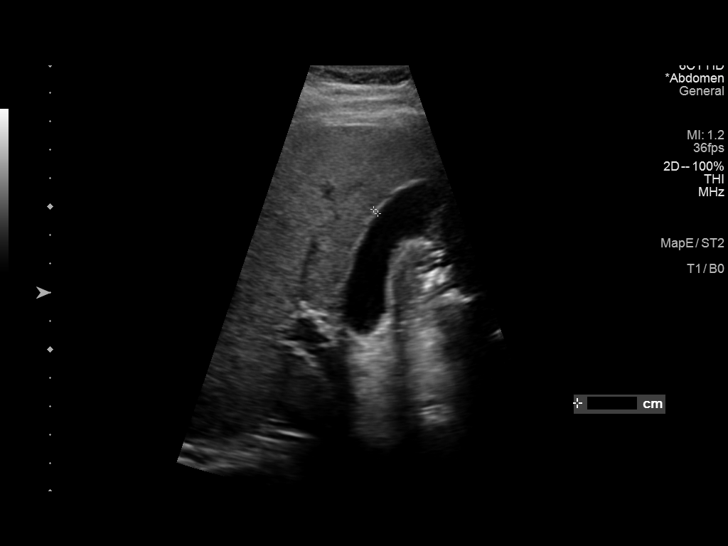
[im 3/31]
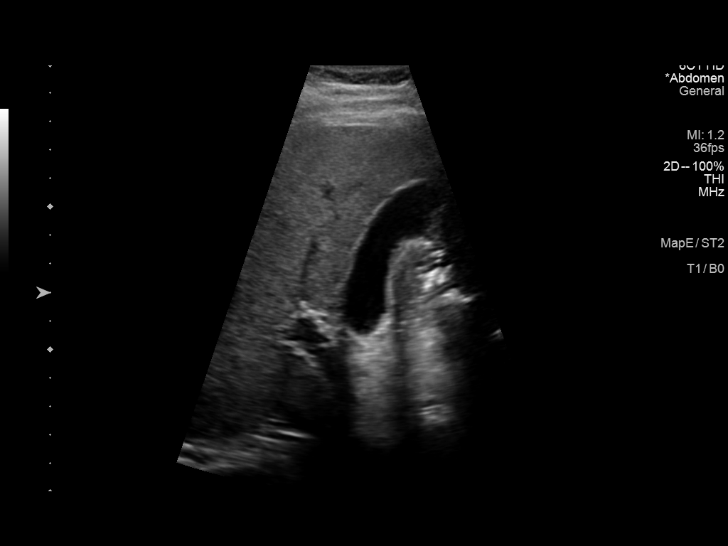
[im 6/31]
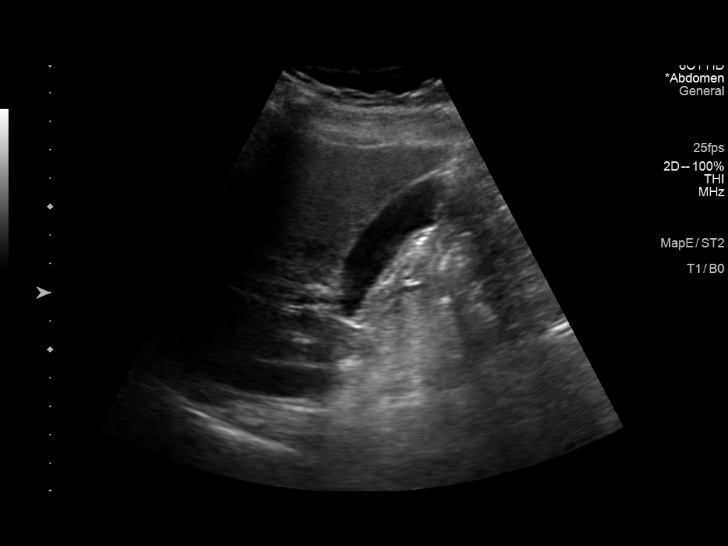
[im 8/31]
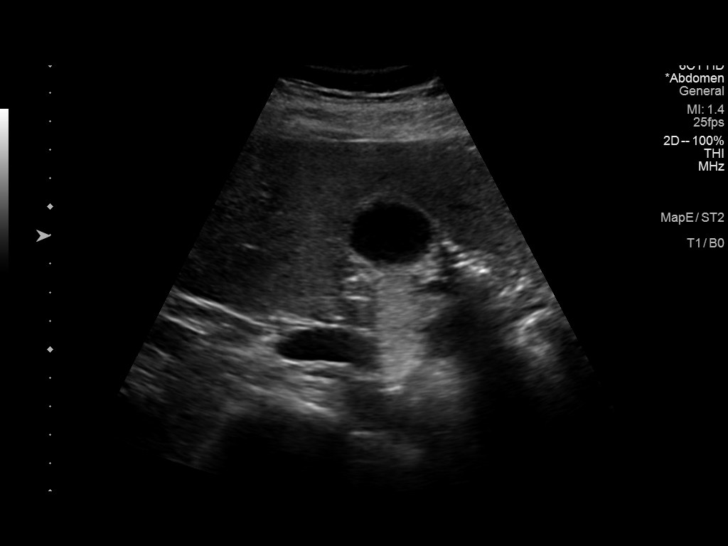
[im 11/31]
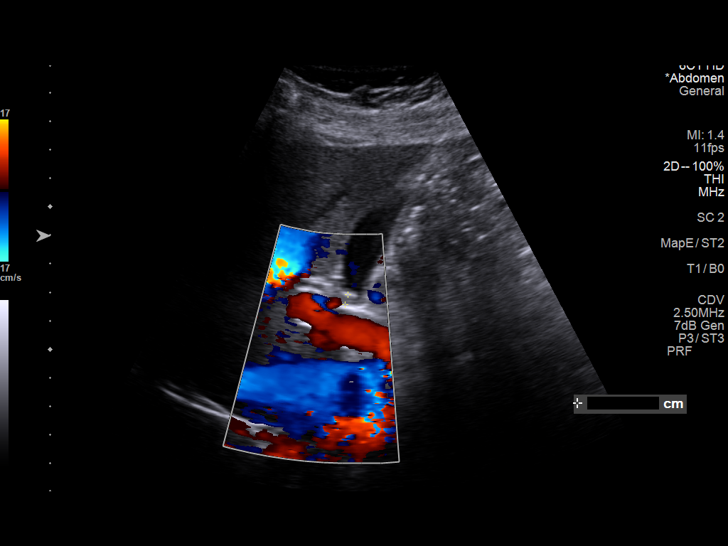
[im 12/31]
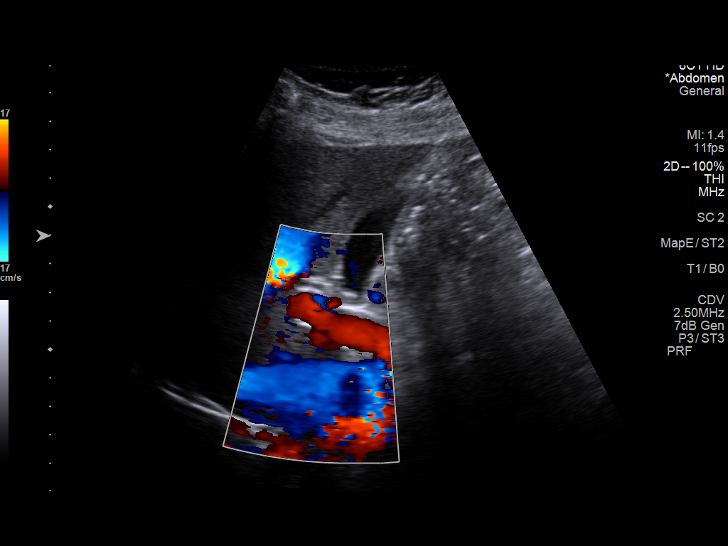
[im 14/31]
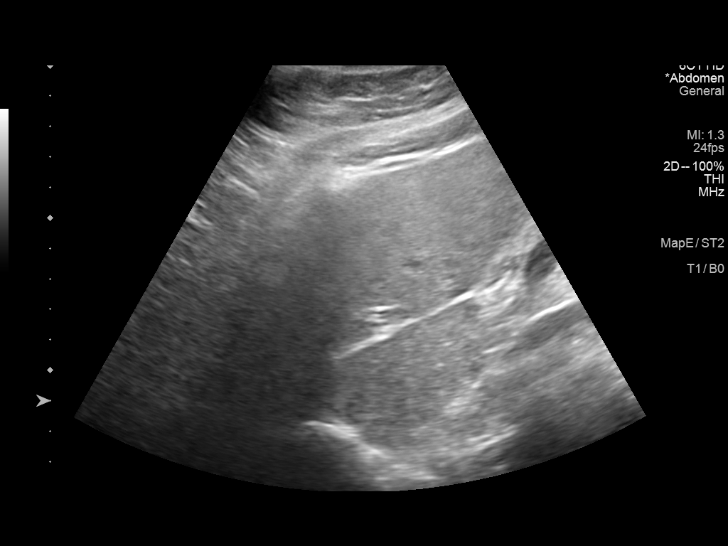
[im 17/31]
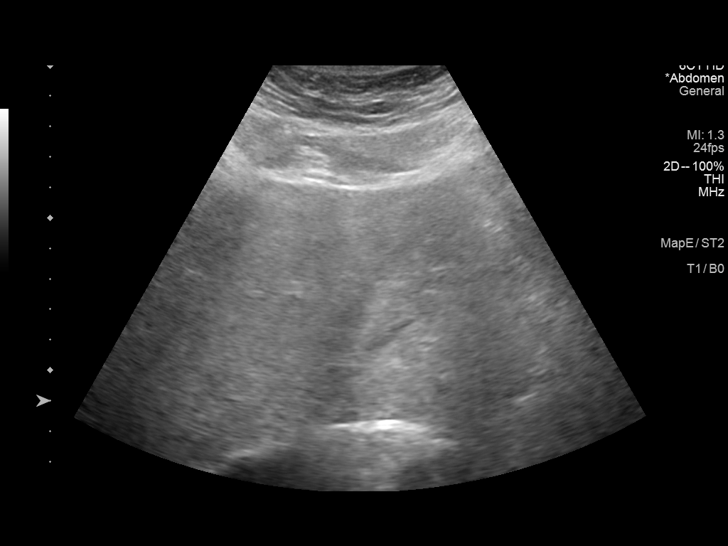
[im 19/31]
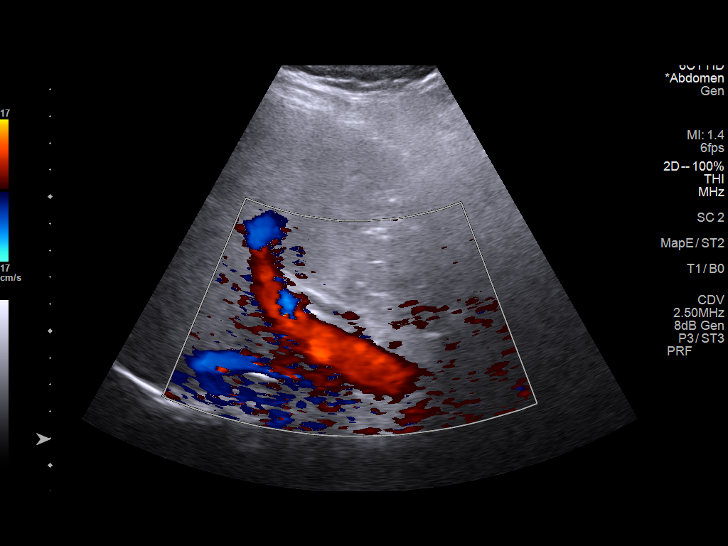
[im 21/31]
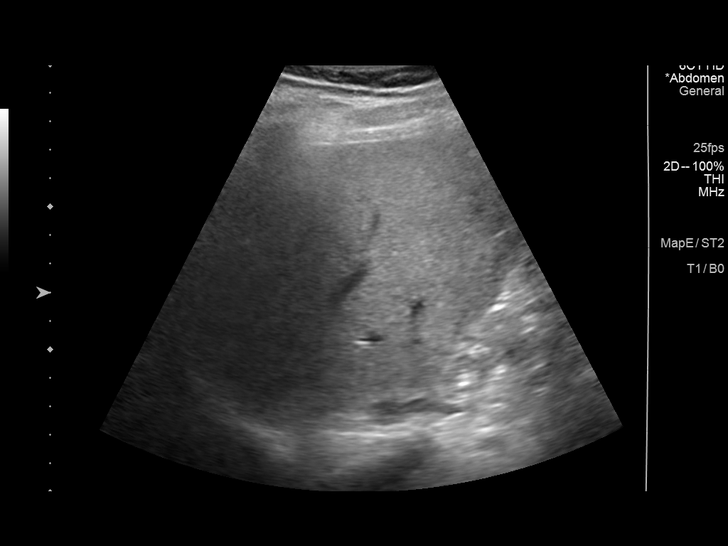
[im 23/31]
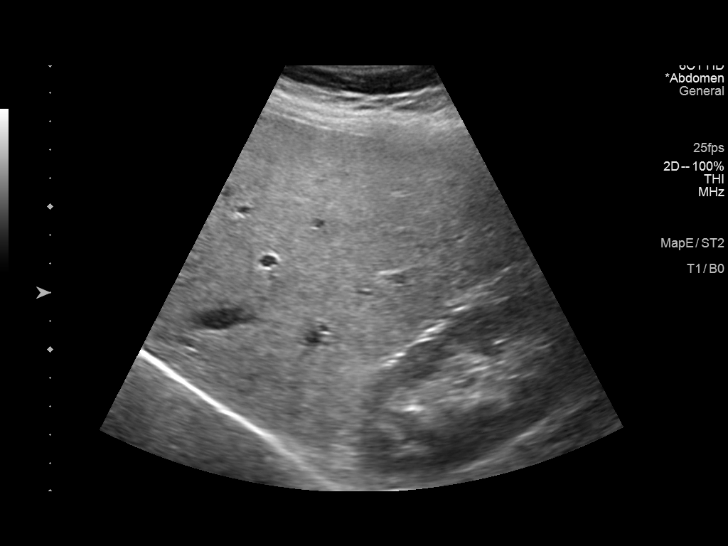
[im 26/31]
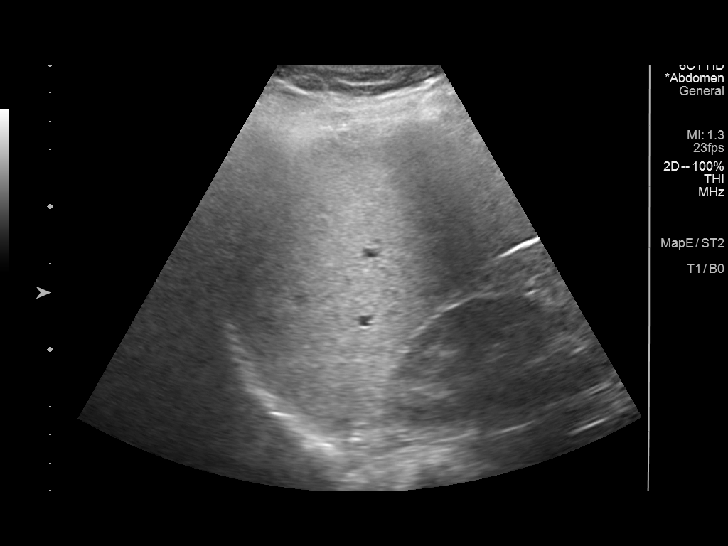
[im 28/31]
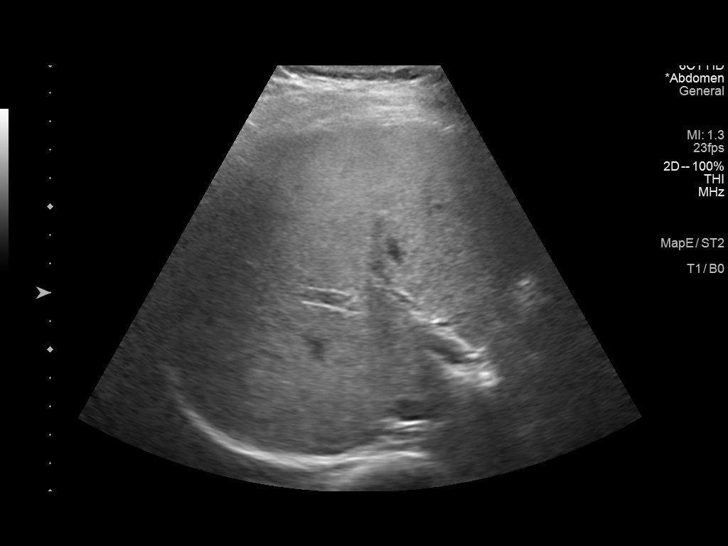
[im 31/31]
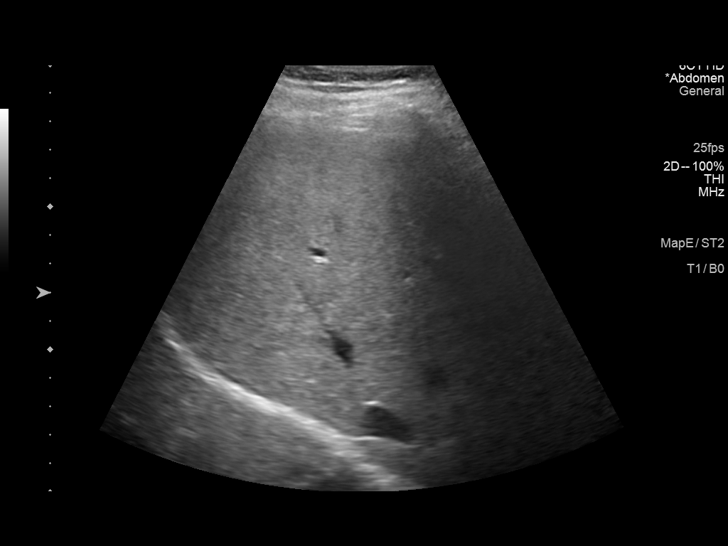

[14 of 25 positions shown; findings below may reference images not displayed]

FINDINGS: Gallbladder:

No gallstones or wall thickening visualized. No sonographic Murphy
sign noted by sonographer.

Common bile duct:

Diameter: 0.4 cm

Liver:

No focal lesion identified. Increased hepatic parenchymal
echogenicity. Portal vein is patent on color Doppler imaging with
normal direction of blood flow towards the liver.

Other: No perihepatic ascites
IMPRESSION: 1. No acute sonographic findings within the abdomen.
2. Echogenic liver. Findings most commonly seen in hepatic
steatosis, though may also represent hepatitis and/or fibrosis.
